# Patient Record
Sex: Male | Born: 1955 | Race: White | Hispanic: No | Smoking: Former smoker
Health system: Southern US, Community
[De-identification: ages and names within clinical notes are randomized; demographics above are authoritative.]

## PROBLEM LIST (undated history)

## (undated) DIAGNOSIS — J45909 Unspecified asthma, uncomplicated: Secondary | ICD-10-CM

## (undated) DIAGNOSIS — G919 Hydrocephalus, unspecified: Secondary | ICD-10-CM

## (undated) DIAGNOSIS — M1990 Unspecified osteoarthritis, unspecified site: Secondary | ICD-10-CM

## (undated) DIAGNOSIS — R51 Headache: Secondary | ICD-10-CM

## (undated) DIAGNOSIS — E119 Type 2 diabetes mellitus without complications: Secondary | ICD-10-CM

## (undated) DIAGNOSIS — G4730 Sleep apnea, unspecified: Secondary | ICD-10-CM

## (undated) DIAGNOSIS — J449 Chronic obstructive pulmonary disease, unspecified: Secondary | ICD-10-CM

## (undated) DIAGNOSIS — I10 Essential (primary) hypertension: Secondary | ICD-10-CM

## (undated) DIAGNOSIS — R519 Headache, unspecified: Secondary | ICD-10-CM

## (undated) DIAGNOSIS — IMO0001 Reserved for inherently not codable concepts without codable children: Secondary | ICD-10-CM

## (undated) HISTORY — DX: Sleep apnea, unspecified: G47.30

## (undated) HISTORY — DX: Essential (primary) hypertension: I10

## (undated) HISTORY — DX: Type 2 diabetes mellitus without complications: E11.9

## (undated) HISTORY — DX: Hydrocephalus, unspecified: G91.9

## (undated) HISTORY — PX: VENTRICULOPERITONEAL SHUNT: SHX204

## (undated) HISTORY — PX: TONSILLECTOMY: SUR1361

## (undated) HISTORY — DX: Unspecified asthma, uncomplicated: J45.909

## (undated) HISTORY — PX: OTHER SURGICAL HISTORY: SHX169

---

## 2002-02-14 ENCOUNTER — Encounter: Admission: RE | Admit: 2002-02-14 | Discharge: 2002-02-14 | Payer: Self-pay | Admitting: *Deleted

## 2002-02-27 ENCOUNTER — Encounter: Payer: Self-pay | Admitting: Neurosurgery

## 2002-02-28 ENCOUNTER — Encounter: Payer: Self-pay | Admitting: Neurosurgery

## 2002-02-28 ENCOUNTER — Inpatient Hospital Stay (HOSPITAL_COMMUNITY): Admission: RE | Admit: 2002-02-28 | Discharge: 2002-03-03 | Payer: Self-pay | Admitting: Neurosurgery

## 2002-04-27 ENCOUNTER — Encounter: Payer: Self-pay | Admitting: Neurosurgery

## 2002-04-27 ENCOUNTER — Encounter: Admission: RE | Admit: 2002-04-27 | Discharge: 2002-04-27 | Payer: Self-pay | Admitting: Neurosurgery

## 2004-10-16 ENCOUNTER — Encounter: Admission: RE | Admit: 2004-10-16 | Discharge: 2005-01-14 | Payer: Self-pay | Admitting: Internal Medicine

## 2006-07-13 ENCOUNTER — Encounter: Admission: RE | Admit: 2006-07-13 | Discharge: 2006-07-13 | Payer: Self-pay | Admitting: Internal Medicine

## 2006-11-11 ENCOUNTER — Encounter: Admission: RE | Admit: 2006-11-11 | Discharge: 2006-11-11 | Payer: Self-pay | Admitting: Internal Medicine

## 2007-01-28 ENCOUNTER — Emergency Department (HOSPITAL_COMMUNITY): Admission: EM | Admit: 2007-01-28 | Discharge: 2007-01-28 | Payer: Self-pay | Admitting: Emergency Medicine

## 2007-10-19 ENCOUNTER — Ambulatory Visit: Payer: Self-pay | Admitting: Family Medicine

## 2007-10-20 ENCOUNTER — Ambulatory Visit: Payer: Self-pay | Admitting: Internal Medicine

## 2007-10-20 LAB — CONVERTED CEMR LAB
AST: 24 units/L (ref 0–37)
Albumin: 4.5 g/dL (ref 3.5–5.2)
BUN: 10 mg/dL (ref 6–23)
Chloride: 101 meq/L (ref 96–112)
Cholesterol: 180 mg/dL (ref 0–200)
Creatinine, Ser: 0.83 mg/dL (ref 0.40–1.50)
Eosinophils Relative: 2 % (ref 0–5)
Hemoglobin: 14.9 g/dL (ref 13.0–17.0)
LDL Cholesterol: 87 mg/dL (ref 0–99)
Lymphs Abs: 1.5 10*3/uL (ref 0.7–4.0)
Monocytes Absolute: 0.9 10*3/uL (ref 0.1–1.0)
Monocytes Relative: 15 % — ABNORMAL HIGH (ref 3–12)
Neutro Abs: 3.3 10*3/uL (ref 1.7–7.7)
Potassium: 4 meq/L (ref 3.5–5.3)
RBC: 4.76 M/uL (ref 4.22–5.81)
RDW: 13.5 % (ref 11.5–15.5)
Total Bilirubin: 0.7 mg/dL (ref 0.3–1.2)
Total CHOL/HDL Ratio: 3.8
VLDL: 46 mg/dL — ABNORMAL HIGH (ref 0–40)

## 2007-10-25 ENCOUNTER — Ambulatory Visit: Payer: Self-pay | Admitting: *Deleted

## 2007-12-05 ENCOUNTER — Ambulatory Visit: Payer: Self-pay | Admitting: Internal Medicine

## 2007-12-12 ENCOUNTER — Encounter (INDEPENDENT_AMBULATORY_CARE_PROVIDER_SITE_OTHER): Payer: Self-pay | Admitting: Internal Medicine

## 2007-12-12 ENCOUNTER — Ambulatory Visit: Payer: Self-pay | Admitting: Family Medicine

## 2007-12-12 LAB — CONVERTED CEMR LAB
BUN: 12 mg/dL (ref 6–23)
Creatinine, Ser: 0.85 mg/dL (ref 0.40–1.50)
Potassium: 3.7 meq/L (ref 3.5–5.3)
Sodium: 141 meq/L (ref 135–145)

## 2008-01-04 ENCOUNTER — Ambulatory Visit: Payer: Self-pay | Admitting: Internal Medicine

## 2008-02-03 ENCOUNTER — Ambulatory Visit: Payer: Self-pay | Admitting: Internal Medicine

## 2008-02-23 ENCOUNTER — Ambulatory Visit: Payer: Self-pay | Admitting: Internal Medicine

## 2008-02-24 ENCOUNTER — Ambulatory Visit: Payer: Self-pay | Admitting: Family Medicine

## 2008-03-09 ENCOUNTER — Ambulatory Visit: Payer: Self-pay | Admitting: Internal Medicine

## 2008-03-22 ENCOUNTER — Ambulatory Visit: Payer: Self-pay | Admitting: Internal Medicine

## 2008-03-27 ENCOUNTER — Ambulatory Visit: Payer: Self-pay | Admitting: Internal Medicine

## 2008-05-14 ENCOUNTER — Ambulatory Visit: Payer: Self-pay | Admitting: Internal Medicine

## 2008-06-07 ENCOUNTER — Ambulatory Visit: Payer: Self-pay | Admitting: Internal Medicine

## 2008-06-07 ENCOUNTER — Emergency Department (HOSPITAL_COMMUNITY): Admission: EM | Admit: 2008-06-07 | Discharge: 2008-06-07 | Payer: Self-pay | Admitting: Family Medicine

## 2008-06-28 ENCOUNTER — Ambulatory Visit: Payer: Self-pay | Admitting: Internal Medicine

## 2008-06-28 ENCOUNTER — Encounter: Payer: Self-pay | Admitting: Family Medicine

## 2008-06-28 LAB — CONVERTED CEMR LAB
Cholesterol: 224 mg/dL — ABNORMAL HIGH (ref 0–200)
Triglycerides: 372 mg/dL — ABNORMAL HIGH (ref <150)
VLDL: 74 mg/dL — ABNORMAL HIGH (ref 0–40)

## 2008-07-04 ENCOUNTER — Ambulatory Visit: Payer: Self-pay | Admitting: Internal Medicine

## 2008-07-04 ENCOUNTER — Encounter: Payer: Self-pay | Admitting: Family Medicine

## 2008-07-04 LAB — CONVERTED CEMR LAB
AST: 23 units/L (ref 0–37)
Alkaline Phosphatase: 77 units/L (ref 39–117)
Creatinine, Ser: 0.75 mg/dL (ref 0.40–1.50)
Glucose, Bld: 99 mg/dL (ref 70–99)

## 2008-07-25 ENCOUNTER — Ambulatory Visit: Payer: Self-pay | Admitting: Family Medicine

## 2008-09-25 ENCOUNTER — Ambulatory Visit: Payer: Self-pay | Admitting: Family Medicine

## 2008-10-03 ENCOUNTER — Ambulatory Visit: Payer: Self-pay | Admitting: Sports Medicine

## 2008-10-03 DIAGNOSIS — M25519 Pain in unspecified shoulder: Secondary | ICD-10-CM | POA: Insufficient documentation

## 2008-10-03 DIAGNOSIS — M25569 Pain in unspecified knee: Secondary | ICD-10-CM

## 2008-10-09 ENCOUNTER — Telehealth (INDEPENDENT_AMBULATORY_CARE_PROVIDER_SITE_OTHER): Payer: Self-pay | Admitting: Family Medicine

## 2008-11-12 ENCOUNTER — Ambulatory Visit: Payer: Self-pay | Admitting: Family Medicine

## 2008-11-28 ENCOUNTER — Ambulatory Visit: Payer: Self-pay | Admitting: Sports Medicine

## 2008-11-28 DIAGNOSIS — M2140 Flat foot [pes planus] (acquired), unspecified foot: Secondary | ICD-10-CM | POA: Insufficient documentation

## 2008-11-28 DIAGNOSIS — M722 Plantar fascial fibromatosis: Secondary | ICD-10-CM | POA: Insufficient documentation

## 2008-11-28 DIAGNOSIS — M79609 Pain in unspecified limb: Secondary | ICD-10-CM

## 2008-12-17 ENCOUNTER — Ambulatory Visit: Payer: Self-pay | Admitting: Family Medicine

## 2008-12-18 ENCOUNTER — Ambulatory Visit (HOSPITAL_COMMUNITY): Admission: RE | Admit: 2008-12-18 | Discharge: 2008-12-18 | Payer: Self-pay | Admitting: Family Medicine

## 2009-01-18 ENCOUNTER — Emergency Department (HOSPITAL_COMMUNITY): Admission: EM | Admit: 2009-01-18 | Discharge: 2009-01-18 | Payer: Self-pay | Admitting: Family Medicine

## 2009-04-30 ENCOUNTER — Ambulatory Visit (HOSPITAL_COMMUNITY): Admission: RE | Admit: 2009-04-30 | Discharge: 2009-04-30 | Payer: Self-pay | Admitting: Neurosurgery

## 2009-05-10 ENCOUNTER — Ambulatory Visit: Payer: Self-pay | Admitting: Family Medicine

## 2009-05-24 ENCOUNTER — Inpatient Hospital Stay (HOSPITAL_COMMUNITY): Admission: RE | Admit: 2009-05-24 | Discharge: 2009-05-25 | Payer: Self-pay | Admitting: Neurosurgery

## 2009-06-17 ENCOUNTER — Ambulatory Visit: Payer: Self-pay | Admitting: Internal Medicine

## 2009-07-22 ENCOUNTER — Encounter: Admission: RE | Admit: 2009-07-22 | Discharge: 2009-07-22 | Payer: Self-pay | Admitting: Neurosurgery

## 2009-09-06 ENCOUNTER — Ambulatory Visit: Payer: Self-pay | Admitting: Internal Medicine

## 2009-09-06 LAB — CONVERTED CEMR LAB
AST: 24 units/L (ref 0–37)
Albumin: 4.2 g/dL (ref 3.5–5.2)
BUN: 15 mg/dL (ref 6–23)
CO2: 27 meq/L (ref 19–32)
Chloride: 106 meq/L (ref 96–112)
Creatinine, Ser: 0.83 mg/dL (ref 0.40–1.50)
HDL: 51 mg/dL (ref >39)
Total CHOL/HDL Ratio: 3.7
VLDL: 54 mg/dL — ABNORMAL HIGH (ref 0–40)

## 2009-09-17 ENCOUNTER — Ambulatory Visit: Payer: Self-pay | Admitting: Internal Medicine

## 2009-10-10 ENCOUNTER — Ambulatory Visit: Payer: Self-pay | Admitting: Internal Medicine

## 2009-11-27 ENCOUNTER — Emergency Department (HOSPITAL_COMMUNITY): Admission: EM | Admit: 2009-11-27 | Discharge: 2009-11-27 | Payer: Self-pay | Admitting: Emergency Medicine

## 2009-12-05 ENCOUNTER — Ambulatory Visit: Payer: Self-pay | Admitting: Family Medicine

## 2009-12-05 ENCOUNTER — Encounter (INDEPENDENT_AMBULATORY_CARE_PROVIDER_SITE_OTHER): Payer: Self-pay | Admitting: Internal Medicine

## 2009-12-05 LAB — CONVERTED CEMR LAB
CO2: 30 meq/L (ref 19–32)
Chloride: 101 meq/L (ref 96–112)
Hgb A1c MFr Bld: 5.7 % (ref 4.6–6.1)
Potassium: 4.1 meq/L (ref 3.5–5.3)
Sodium: 143 meq/L (ref 135–145)

## 2010-04-25 ENCOUNTER — Encounter: Admission: RE | Admit: 2010-04-25 | Discharge: 2010-04-25 | Payer: Self-pay | Admitting: Neurosurgery

## 2010-05-17 ENCOUNTER — Emergency Department (HOSPITAL_COMMUNITY): Admission: EM | Admit: 2010-05-17 | Discharge: 2010-05-18 | Payer: Self-pay | Admitting: Emergency Medicine

## 2010-05-25 ENCOUNTER — Emergency Department (HOSPITAL_COMMUNITY): Admission: EM | Admit: 2010-05-25 | Discharge: 2010-05-25 | Payer: Self-pay | Admitting: Internal Medicine

## 2010-10-19 ENCOUNTER — Encounter: Payer: Self-pay | Admitting: Neurosurgery

## 2011-01-03 LAB — BASIC METABOLIC PANEL
CO2: 30 mEq/L (ref 19–32)
Creatinine, Ser: 0.69 mg/dL (ref 0.4–1.5)
GFR calc non Af Amer: >60 mL/min (ref >60)
Glucose, Bld: 101 mg/dL — ABNORMAL HIGH (ref 70–99)

## 2011-01-03 LAB — CBC
HCT: 40.5 % (ref 39.0–52.0)
MCHC: 33.7 g/dL (ref 30.0–36.0)
RDW: 14.6 % (ref 11.5–15.5)

## 2011-02-10 NOTE — Op Note (Signed)
NAMESTEELE, STRACENER NO.:  [id number]   MEDICAL RECORD NO.:  [id number]          PATIENT TYPE:  INP   LOCATION:  3029                         FACILITY:  [facility]   PHYSICIAN:  Kathaleen Maser. Pool, M.D.    DATE OF BIRTH:  01/29/1956   DATE OF PROCEDURE:  05/24/2009  DATE OF DISCHARGE:                               OPERATIVE REPORT   PREOPERATIVE DIAGNOSIS:  Obstructive hydrocephalus.   POSTOPERATIVE DIAGNOSIS:  Obstructive hydrocephalus.   PROCEDURE NOTE:  Right occipital ventriculoperitoneal shunt.   SURGEON:  Kathaleen Maser. Pool, MD   ASSISTANT:  Tia Alert, MD   ANESTHESIA:  General endotracheal.   INDICATION:  Mr. Bamber is a 55 year old male with history of congenital  hydrocephalus status post VP shunting at age 20.  The patient has  history consistent with chronic shunt malfunction and progressive  hydrocephalus.  The patient presents now for VP shunt placement in hopes  of improving his symptoms.   OPERATIVE NOTE:  The patient was brought to the operating room and  placed on operating table in supine position.  After adequate level of  anesthesia was achieved, the patient placed supine with his neck turned  towards the left.  The patient's right occipital scalp, neck, chest, and  abdomen were prepped and draped sterilely.  A 10 blade was used to make  a linear incision in the right occipital region.  This was carried down  sharply to the pericranium.  A single bur hole was then made.  A pocket  was made inferior to the bur hole.  A shunt passer was then passed from  the cranial wound to a separate abdominal wound which was opened  simultaneously through a right paramedian approach.  This was carried  down to the anterior rectus fascia.  Anterior rectus fascia was split.  The rectus fibers were separated.  Posterior rectus fascia was grasped  and divided.  Peritoneum was identified and incised and opened.  Returning to the cranial wound, the dura was  coagulated.  A 9-cm  straight ventricular catheter was then passed into the ventricle with  good return of CSF under pressure.  This was then connected to the snap  assembly on a Strata II valve which had been set at performance level of  0.5.  There was good flow through the shunt system and out the distal  aspect of the shunt tube.  The distal catheter was then passed in the  peritoneal cavity without difficulty.  Both wounds were then irrigated  with antibiotic solution and closed in typical fashion.  Sterile  dressings were applied.  There were no complications.  The patient  tolerated the procedure well and he returns to recovery room  postoperatively.           ______________________________  Kathaleen Maser Pool, M.D.     HAP/MEDQ  D:  05/24/2009  T:  05/25/2009  Job:  [id number]

## 2011-02-13 NOTE — Op Note (Signed)
[place]. [facility]  Patient:    DONTEA, CORLEW Visit Number: [id number] MRN: [id number]          Service Type: SUR Location: 3000 3023 01 Attending Physician:  Danella Penton Dictated by:   Tanya Nones. Jeral Fruit, M.D. Proc. 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  PROCEDURES: 1. C5 corpectomy, partial of C4 and C6. 2. Foraminotomy to decompress the C5 and C6 nerve roots. 3. Removal of posterior calcified ligament with the microscope. 4. Bone bank graft. 5. Plate.  SURGEON:  Tanya Nones. Jeral Fruit, M.D.  ASSISTANTMena Goes. Franky Macho, M.D.  CLINICAL HISTORY:  The patient was admitted because of weakness of the upper extremities and problems with coordination.  MRI had shown calcification going from the lower part of C4 down to the upper part of C6.  He has a chronic weakness of the biceps and deltoid.  Surgery was advised.  The risks were explained in the history and physical.  DESCRIPTION OF PROCEDURE:  The patient was taken to the OR after intubation which was done in a neutral position.  Ten pounds of traction was applied to the neck.  The patient is 5 feet 2 inches and over 240 pounds of weight, so it was difficult to have a good visualization of the neck.  Nevertheless, the area was prepped with Betadine and drapes were applied.  A longitudinal incision was made through the skin and platysma down into the area of the trachea.  Dissection was carried out, and we were able to visualize the cervical spine.  From then on we had to do two x-rays because it was difficult to see where we were.   The second x-ray showed that we were at the level of C4-5.  From then on the disks at 4-5 and 5-6 were removed using microcurettes, with the help of the microscope.  Using the drill, we did a corpectomy which involved the three-quarters medial of the bone at C5.  The same procedure was done to remove the lower part of C4 and the upper part of C6.  With the microscope, we started doing our microdissection.  Indeed, there was a calcified ligament up to the point that we had to go all the way to 4 before we could see normal dura mater.  Dissection was carried down using the 1 and 2 mm Kerrison punch and with microdissection tried to remove the calcified ligament that was on the spinal cord.  This procedure also was done from C4 down to C5 and from C6 up to C5.  Then we visualized the foramina, which were quite narrow.  Using the 1, 2, and 3 mm Kerrison punch, we were able to do foraminotomy to decompress the C5 and C6 nerve root.  At the end we had a decompression.  Although there was no evidence of any CSF leak, nevertheless we used Tisseel to produce a barrier in the posterior ligament and dura mater. Then a bone graft was tailored that went from C4 down to C6.  Again  three attempts were made to localize the bone graft, but it was difficult. Nevertheless, using micro-hooks we were able to see there was plenty of space between the posterior aspect of the graft and the dura mater.  Then using the plate with four screws, we fixed the bone graft and plate.  Again, one more attempt was done to try to visualize, but it was difficult.  Nevertheless, we knew that we were right at the level of 5 and 6 with the screws.  Having done this, the area was irrigated.  Jackson-Pratt was left in the operative site. The wound was closed with Vicryl and Steri-Strip.  The patient is going to go to the intensive care unit. Dictated by:   Tanya Nones. Jeral Fruit, M.D. Attending Physician:  Danella Penton DD:   02/28/02 TD:  03/02/02 Job: [phone]

## 2011-02-13 NOTE — H&P (Signed)
[place]. [facility]  Patient:    Eric Houston, Eric Houston Visit Number: [id number] MRN: [id number]          Service Type: SUR Location: 3000 3023 01 Attending Physician:  Danella Penton Dictated by:   Tanya Nones. Jeral Fruit, M.D. Admit Date:  02/28/2002                           History and Physical  HISTORY OF PRESENT ILLNESS:  The patient is a gentleman who was seen in my office for several months, who has been complaining of weakness in the upper extremities, some leg pain and burning sensation going to the shoulders.  At work, he had realized that he is having more difficulty because what he does involves quite a bit of lifting.  The patient is quite miserable.  He was seen by me after I took a look at the x-ray and I brought him to my office as an emergency.  PAST MEDICAL HISTORY:  He had a shunt when he was 55 years old; it seems that the shunt was secondary to hydrocephalus.  ALLERGIES:  He is not allergic to any medication.  MEDICATIONS:  He is taking Zoloft for depression, Vicodin and Toprol.  SOCIAL HISTORY:  Negative.  He is 5 feet 6 inches.  He is 280 pounds.  FAMILY HISTORY:  Unremarkable.  REVIEW OF SYSTEMS:  Review of systems is positive for weakness in the arms and the legs, back pain and neck pain.  He does have a history of high blood pressure.  PHYSICAL EXAMINATION:  HEENT:  He has a shunt on the left side; the valve does not work really well. He has bossing of both frontal bones secondary to hydrocephalus.  NECK:  Extension and lateralization cause him discomfort.  LUNGS:  There are some rhonchi bilaterally.  HEART:  Heart sounds normal.  ABDOMEN:  Normal.  EXTREMITIES:  There is brawny edema.  NEUROLOGIC:  Mental status normal.  Cranial nerves normal.  Strength: Deltoids are 1/5, right biceps 2/5, left biceps 3/5 and he has weakness in both wrist extensors.  Triceps are completely normal.  In the lower extremities, I  cannot find any weakness.  Sensation grossly normal.  Reflexes: Hyperreflexia in the upper and lower extremities and questionable Babinski on the right side.  Coordination:  He is unable to walk a straight line.  LABORATORY AND ACCESSORY DATA:  The plain x-ray and the MRI are quite impressive.  This gentleman has calcified ligament which goes from C4, C5 and C6 with old radiation changes of gliosis in the spinal cord.  CLINICAL IMPRESSION:  Cervical stenosis secondary to calcification of posterior ligament from C4 down to C6.  There is some question of spondylosis on the left side at the level of C6-7 with no weakness.  RECOMMENDATION:  I talked to him and his wife at length.  The patient is going to be admitted for surgery.  The procedure will be a C5 corpectomy, probably a partial corpectomy at 4 and 6, and a bone graft and a plate from C4 to C6. Also, there is a possibility that instead of the bone graft, we might put a cage.  The risks of course are infection, paralysis, no improvement whatsoever, need for further surgery, collapse of the bone graft, damage to the vocal cords, damage to the esophagus or stroke.  He declined more opinion.Dictated by:   Tanya Nones. Jeral Fruit, M.D. Attending Physician:  Hilda Lias Clarence DD:  02/28/02 TD:  03/01/02 Job: [phone]

## 2011-02-13 NOTE — Discharge Summary (Signed)
[place]. [facility]  Patient:    Eric Houston, Eric Houston Visit Number: [id number] MRN: [id number]          Service Type: SUR Location: 3000 3023 01 Attending Physician:  Danella Penton Dictated by:   Tanya Nones. Jeral Fruit, M.D. Admit Date:  02/28/2002 Discharge Date: 03/03/2002                             Discharge Summary  ADMITTING DIAGNOSIS:  Cervical stenosis secondary to calcification of the posterior ligament, following the ____ of the same.  CLINICAL HISTORY:  The patient was admitted because ofincoordination and weakness in the upper extremity.  An MRI showed stenosis at the level of C4-5 and C5-6, secondary to calcification of the posterior ligament.  Surgery was advised.  LABORATORY DATA:  Normal.  HOSPITAL COURSE:  The patient was taken to surgery and a C5 corpectomy, as well as a partial C4 and C6 was done.  This was followed by a graft and a plate.  DISPOSITION:  Today the patient is doing much better.  The weakness is gone. He has no difficulty breathing, and has been discharged today, to be seen by me in my office.  CONDITION ON DISCHARGE:  Improving.  DISCHARGE MEDICATIONS:  Percocet and Flexeril.  He is to continue taking all of his previous medications.  DIET:  Regular, although he was encouraged to lose weight.  ACTIVITIES:  Not to drive for at least 10 days.  FOLLOWUP:  In four weeks in my office.  Dictated by:   Tanya Nones. Jeral Fruit, M.D. Attending Physician:  Danella Penton DD:  03/03/02 TD:  03/06/02 Job: [id number]

## 2011-05-28 ENCOUNTER — Ambulatory Visit
Admission: RE | Admit: 2011-05-28 | Discharge: 2011-05-28 | Disposition: A | Discharge status: Home / Self Care | Payer: Medicare HMO | Source: Ambulatory Visit | Attending: Geriatric Medicine | Admitting: Geriatric Medicine

## 2011-05-28 ENCOUNTER — Other Ambulatory Visit: Payer: Self-pay | Admitting: Geriatric Medicine

## 2011-05-28 DIAGNOSIS — R0600 Dyspnea, unspecified: Secondary | ICD-10-CM

## 2011-05-28 DIAGNOSIS — R05 Cough: Secondary | ICD-10-CM

## 2011-05-28 DIAGNOSIS — R062 Wheezing: Secondary | ICD-10-CM

## 2011-06-15 ENCOUNTER — Encounter: Payer: Self-pay | Admitting: Critical Care Medicine

## 2011-06-15 ENCOUNTER — Ambulatory Visit: Payer: Medicare HMO | Admitting: Critical Care Medicine

## 2011-06-15 ENCOUNTER — Ambulatory Visit (INDEPENDENT_AMBULATORY_CARE_PROVIDER_SITE_OTHER): Payer: Medicare HMO | Admitting: Critical Care Medicine

## 2011-06-15 DIAGNOSIS — G919 Hydrocephalus, unspecified: Secondary | ICD-10-CM | POA: Insufficient documentation

## 2011-06-15 DIAGNOSIS — J45909 Unspecified asthma, uncomplicated: Secondary | ICD-10-CM | POA: Insufficient documentation

## 2011-06-15 DIAGNOSIS — I10 Essential (primary) hypertension: Secondary | ICD-10-CM

## 2011-06-15 DIAGNOSIS — E119 Type 2 diabetes mellitus without complications: Secondary | ICD-10-CM | POA: Insufficient documentation

## 2011-06-15 DIAGNOSIS — G4730 Sleep apnea, unspecified: Secondary | ICD-10-CM

## 2011-06-15 DIAGNOSIS — G911 Obstructive hydrocephalus: Secondary | ICD-10-CM

## 2011-06-15 MED ORDER — BUDESONIDE-FORMOTEROL FUMARATE 160-4.5 MCG/ACT IN AERO: 2.0000 | INHALATION_SPRAY | Freq: Two times a day (BID) | RESPIRATORY_TRACT | Status: DC

## 2011-06-15 MED ORDER — PREDNISONE 10 MG PO TABS: 10.0000 mg | ORAL_TABLET | Freq: Every day | ORAL | Status: DC

## 2011-06-15 MED ORDER — TIOTROPIUM BROMIDE MONOHYDRATE 18 MCG IN CAPS: ORAL_CAPSULE | RESPIRATORY_TRACT | Status: DC

## 2011-06-15 MED ORDER — PREDNISONE 10 MG PO TABS: ORAL_TABLET | ORAL | Status: DC

## 2011-06-15 MED ORDER — LOSARTAN POTASSIUM-HCTZ 100-25 MG PO TABS: 1.0000 | ORAL_TABLET | Freq: Every day | ORAL | Status: DC

## 2011-06-15 NOTE — Patient Instructions (Addendum)
Stop lisinopril/hctz Start losartan/hctz one daily Prednisone 10mg  Take 4 for two days three for two days two for two days one for two days then stop Symbicort two puff twice daily When current spiriva runs out , do not refill Obtain a full pulmonary function study Return 6 weeks

## 2011-06-15 NOTE — Assessment & Plan Note (Addendum)
Asthmatic bronchitis with chronic airflow obstruction and cyclical cough precipitated by an Ace inhibitor  Plan Stop lisinopril/hctz Start losartan/hctz one daily Prednisone 10mg  Take 4 for two days three for two days two for two days one for two days then stop Symbicort two puff twice daily D/c  current spiriva runs out , do not refill Obtain a full pulmonary function study Return 6 weeks

## 2011-06-15 NOTE — Progress Notes (Signed)
Subjective:    Patient ID: Eric Houston, male    DOB: 03/21/1956, 55 y.o.   MRN: [id number]  Shortness of Breath This is a chronic problem. The current episode started more than 1 year ago. The problem occurs daily (exertional only). The problem has been gradually worsening. Associated symptoms include chest pain, rhinorrhea, sputum production and wheezing. Pertinent negatives include no abdominal pain, fever, headaches, hemoptysis, leg swelling, orthopnea, PND, sore throat or vomiting. The symptoms are aggravated by smoke, weather changes, odors, fumes, any activity, exercise and lying flat. Associated symptoms comments: Has L sided rib pain Mucus is clear . He has tried beta agonist inhalers for the symptoms. The treatment provided no relief. His past medical history is significant for asthma. There is no history of CAD, COPD, DVT, a heart failure, PE or pneumonia.  Cough This is a chronic problem. The current episode started more than 1 year ago. The problem has been gradually worsening. The cough is productive of sputum. Associated symptoms include chest pain, postnasal drip, rhinorrhea, shortness of breath and wheezing. Pertinent negatives include no fever, headaches, hemoptysis, nasal congestion or sore throat. The symptoms are aggravated by fumes, pollens, lying down, dust and exercise. He has tried a beta-agonist inhaler for the symptoms. The treatment provided no relief. His past medical history is significant for asthma and bronchitis. There is no history of bronchiectasis, COPD, emphysema, environmental allergies or pneumonia.   55 y.o.WM  Dx AB as child.  Now ? Copd.  Not much of a smoker, had passive smoke exposure.   Past Medical History  Diagnosis Date  . Hypertension   . Diabetes mellitus type II   . Asthmatic bronchitis   . Sleep apnea   . Hydrocephalus     vp shunt     Family History  Problem Relation Age of Onset  . Emphysema Father   . Cancer Mother     pancreatic    . Diabetes Mother   . Diabetes Father   . Diabetes Brother      History   Social History  . Marital Status: Legally Separated    Spouse Name: N/A    Number of Children: 2  . Years of Education: N/A   Occupational History  . Disability    Social History Main Topics  . Smoking status: Former Smoker -- 2.0 packs/day for 2 years    Types: Cigarettes    Quit date: 11/14/1984  . Smokeless tobacco: Never Used  . Alcohol Use: Yes     occasionally on weekends  . Drug Use: No  . Sexually Active: Not on file   Other Topics Concern  . Not on file   Social History Narrative  . No narrative on file     Allergies  Allergen Reactions  . Sulfa Antibiotics     rash     No outpatient prescriptions prior to visit.     Review of Systems  Constitutional: Negative for fever.  HENT: Positive for rhinorrhea and postnasal drip. Negative for sore throat and sneezing.   Respiratory: Positive for cough, sputum production, shortness of breath and wheezing. Negative for hemoptysis.   Cardiovascular: Positive for chest pain. Negative for orthopnea, leg swelling and PND.  Gastrointestinal: Negative for vomiting and abdominal pain.       Hx of heartburn, on meds in past for GERD  Neurological: Negative for dizziness and headaches.  Hematological: Negative for environmental allergies.  Psychiatric/Behavioral: The patient is nervous/anxious.    Constitutional:  No  weight loss, night sweats,  Fevers, chills, fatigue, lassitude. HEENT:   No headaches,  Difficulty swallowing,  Tooth/dental problems,  Sore throat,                No sneezing, itching, ear ache, nasal congestion, post nasal drip,   CV:  No chest pain,  Orthopnea, PND, swelling in lower extremities, anasarca, dizziness, palpitations  GI  No heartburn, indigestion, abdominal pain, nausea, vomiting, diarrhea, change in bowel habits, loss of appetite  Resp: Notes  shortness of breath with exertion not  at rest.  Notes  excess  mucus, notes  productive cough,  No non-productive cough,  No coughing up of blood.  No change in color of mucus.  No wheezing.  No chest wall deformity  Skin: no rash or lesions.  GU: no dysuria, change in color of urine, no urgency or frequency.  No flank pain.  MS:  No joint pain or swelling.  No decreased range of motion.  No back pain.  Psych:  No change in mood or affect. No depression or anxiety.  No memory loss.     Objective:   Physical Exam Filed Vitals:   06/15/11 1501  BP: 150/98  Pulse: 93  Temp: 98.1 F (36.7 C)  TempSrc: Oral  Height: 5\' 6"  (1.676 m)  Weight: 322 lb 12.8 oz (146.421 kg)  SpO2: 94%    Gen: Pleasant, well-nourished, in no distress,  normal affect  ENT: No lesions,  mouth clear,  oropharynx clear, no postnasal drip  Neck: No JVD, no TMG, no carotid bruits  Lungs: No use of accessory muscles, no dullness to percussion, distant BS, poor airway function  Cardiovascular: RRR, heart sounds normal, no murmur or gallops, no peripheral edema  Abdomen: soft and NT, no HSM,  BS normal  Musculoskeletal: No deformities, no cyanosis or clubbing  Neuro: alert, non focal  Skin: Warm, no lesions or rashes  CXR: chronic bronchial wall thickening NAD      Assessment & Plan:   Asthmatic bronchitis Asthmatic bronchitis with chronic airflow obstruction and cyclical cough precipitated by an Ace inhibitor  Plan Stop lisinopril/hctz Start losartan/hctz one daily Prednisone 10mg  Take 4 for two days three for two days two for two days one for two days then stop Symbicort two puff twice daily D/c  current spiriva runs out , do not refill Obtain a full pulmonary function study Return 6 weeks       Updated Medication List Outpatient Encounter Prescriptions as of 06/15/2011  Medication Sig Dispense Refill  . albuterol (VENTOLIN HFA) 108 (90 BASE) MCG/ACT inhaler Inhale 2 puffs into the lungs 4 (four) times daily.        . Loratadine 10 MG CAPS Take  1 capsule by mouth daily.        . metFORMIN (GLUCOPHAGE) 500 MG tablet Take 500 mg by mouth 2 (two) times daily.        . promethazine-codeine (PHENERGAN WITH CODEINE) 6.25-10 MG/5ML syrup 1 tsp every 4-6 hours as needed       . tiotropium (SPIRIVA) 18 MCG inhalation capsule When current supply runs out, do NOT refill  30 capsule    . DISCONTD: lisinopril-hydrochlorothiazide (PRINZIDE,ZESTORETIC) 20-25 MG per tablet Take 1 tablet by mouth daily.        Marland Kitchen DISCONTD: tiotropium (SPIRIVA) 18 MCG inhalation capsule Place 18 mcg into inhaler and inhale daily.        . budesonide-formoterol (SYMBICORT) 160-4.5 MCG/ACT inhaler Inhale 2 puffs  into the lungs 2 (two) times daily.  1 Inhaler  12  . losartan-hydrochlorothiazide (HYZAAR) 100-25 MG per tablet Take 1 tablet by mouth daily.  30 tablet  6  . predniSONE (DELTASONE) 10 MG tablet Take 4 for two days three for two days two for two days one for two days  20 tablet  0  . DISCONTD: predniSONE (DELTASONE) 10 MG tablet Take 1 tablet (10 mg total) by mouth daily.  10 tablet  0

## 2011-06-19 ENCOUNTER — Institutional Professional Consult (permissible substitution): Payer: Medicare HMO | Admitting: Critical Care Medicine

## 2011-06-25 ENCOUNTER — Ambulatory Visit (INDEPENDENT_AMBULATORY_CARE_PROVIDER_SITE_OTHER): Payer: Medicare HMO | Admitting: Critical Care Medicine

## 2011-06-25 DIAGNOSIS — J45909 Unspecified asthma, uncomplicated: Secondary | ICD-10-CM

## 2011-06-25 LAB — PULMONARY FUNCTION TEST

## 2011-06-25 NOTE — Progress Notes (Signed)
PFT done today. 

## 2011-06-30 ENCOUNTER — Telehealth: Payer: Self-pay | Admitting: Critical Care Medicine

## 2011-06-30 DIAGNOSIS — J45909 Unspecified asthma, uncomplicated: Secondary | ICD-10-CM

## 2011-06-30 NOTE — Telephone Encounter (Signed)
I called and left msg on results of PFTs  Please manually enter PFTs into EMR

## 2011-07-01 ENCOUNTER — Encounter: Payer: Self-pay | Admitting: Critical Care Medicine

## 2011-07-01 LAB — POCT I-STAT, CHEM 8
Creatinine, Ser: 0.9
HCT: 42
Hemoglobin: 14.3
Sodium: 140
TCO2: 30

## 2011-07-01 NOTE — Telephone Encounter (Signed)
PFT results entered into Epic.

## 2011-07-02 NOTE — Telephone Encounter (Signed)
Will place PFTs in PW's scan folder to be scanned into pt's chart.

## 2011-07-27 ENCOUNTER — Ambulatory Visit: Payer: Medicare HMO | Admitting: Critical Care Medicine

## 2011-08-19 ENCOUNTER — Ambulatory Visit: Payer: Medicare HMO | Admitting: Critical Care Medicine

## 2012-01-25 ENCOUNTER — Other Ambulatory Visit: Payer: Self-pay | Admitting: Critical Care Medicine

## 2012-06-21 ENCOUNTER — Other Ambulatory Visit: Payer: Self-pay | Admitting: Neurosurgery

## 2012-06-21 ENCOUNTER — Ambulatory Visit
Admission: RE | Admit: 2012-06-21 | Discharge: 2012-06-21 | Disposition: A | Discharge status: Home / Self Care | Payer: Medicare HMO | Source: Ambulatory Visit | Attending: Neurosurgery | Admitting: Neurosurgery

## 2012-06-21 DIAGNOSIS — Q039 Congenital hydrocephalus, unspecified: Secondary | ICD-10-CM

## 2013-03-04 IMAGING — CR DG CHEST 2V
3 series · 3 of 3 positions shown · non-contrast
Comparison: Chest x-ray 05/21/2009.

CLINICAL DATA: Cough and wheezing.

CHEST - 2 VIEW

[view not recorded (1 of 3)]
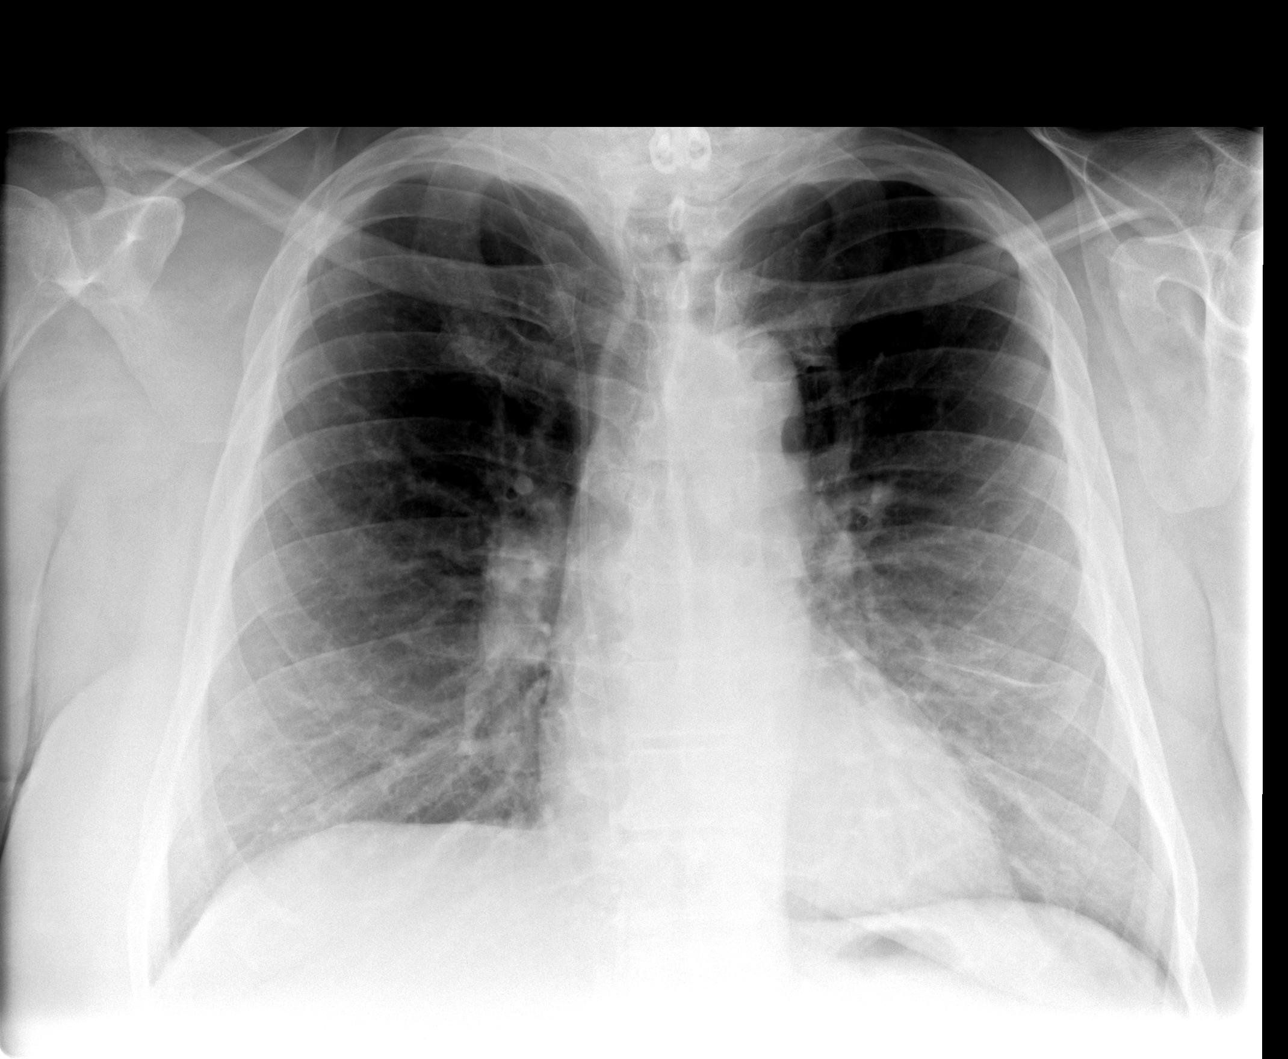

[view not recorded (2 of 3)]
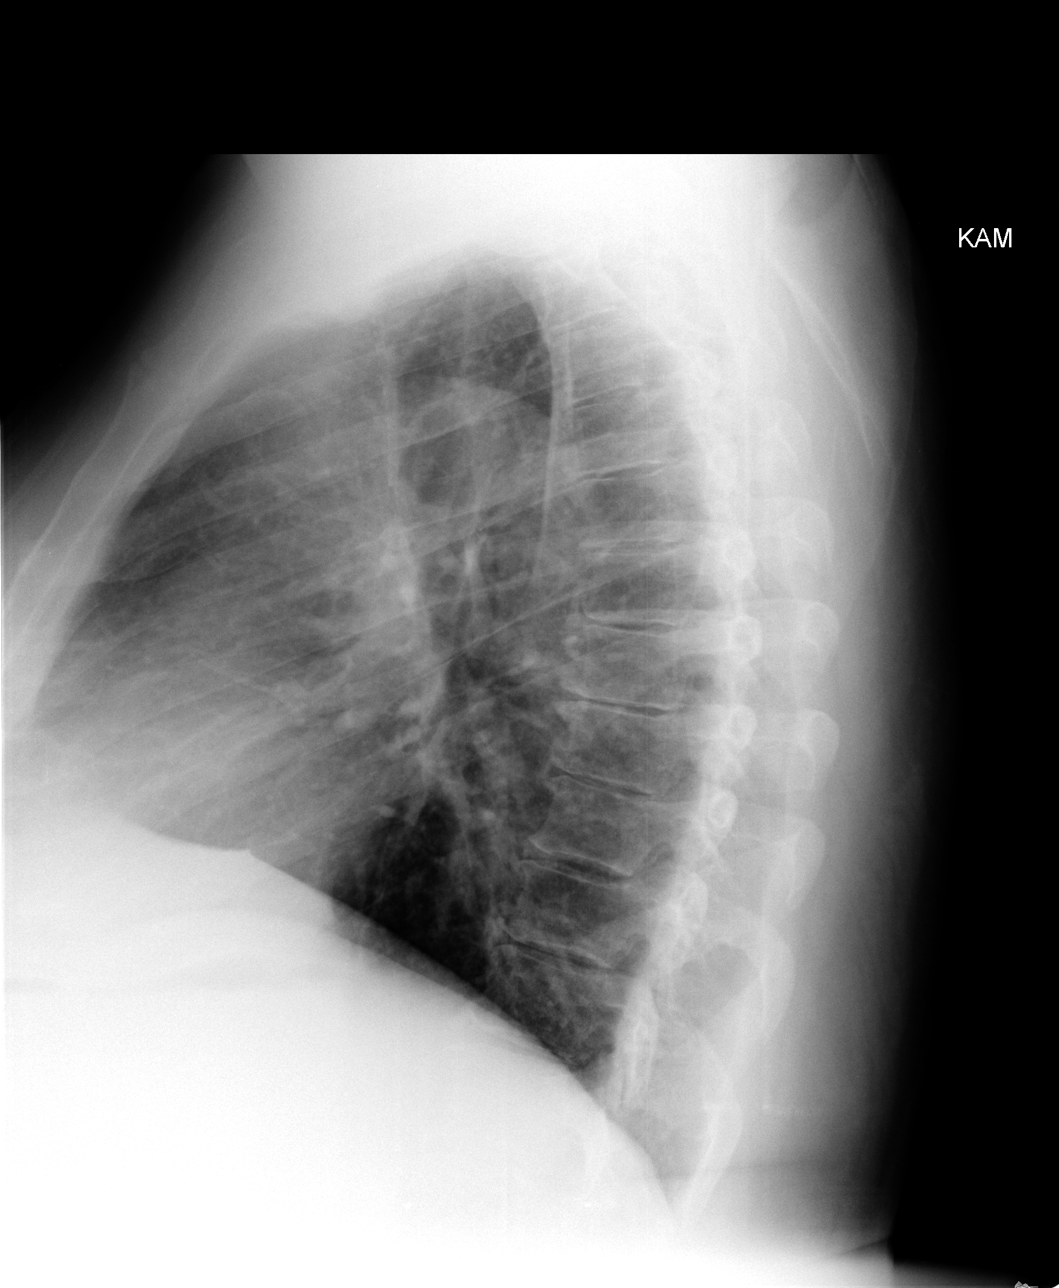

[view not recorded (3 of 3)]
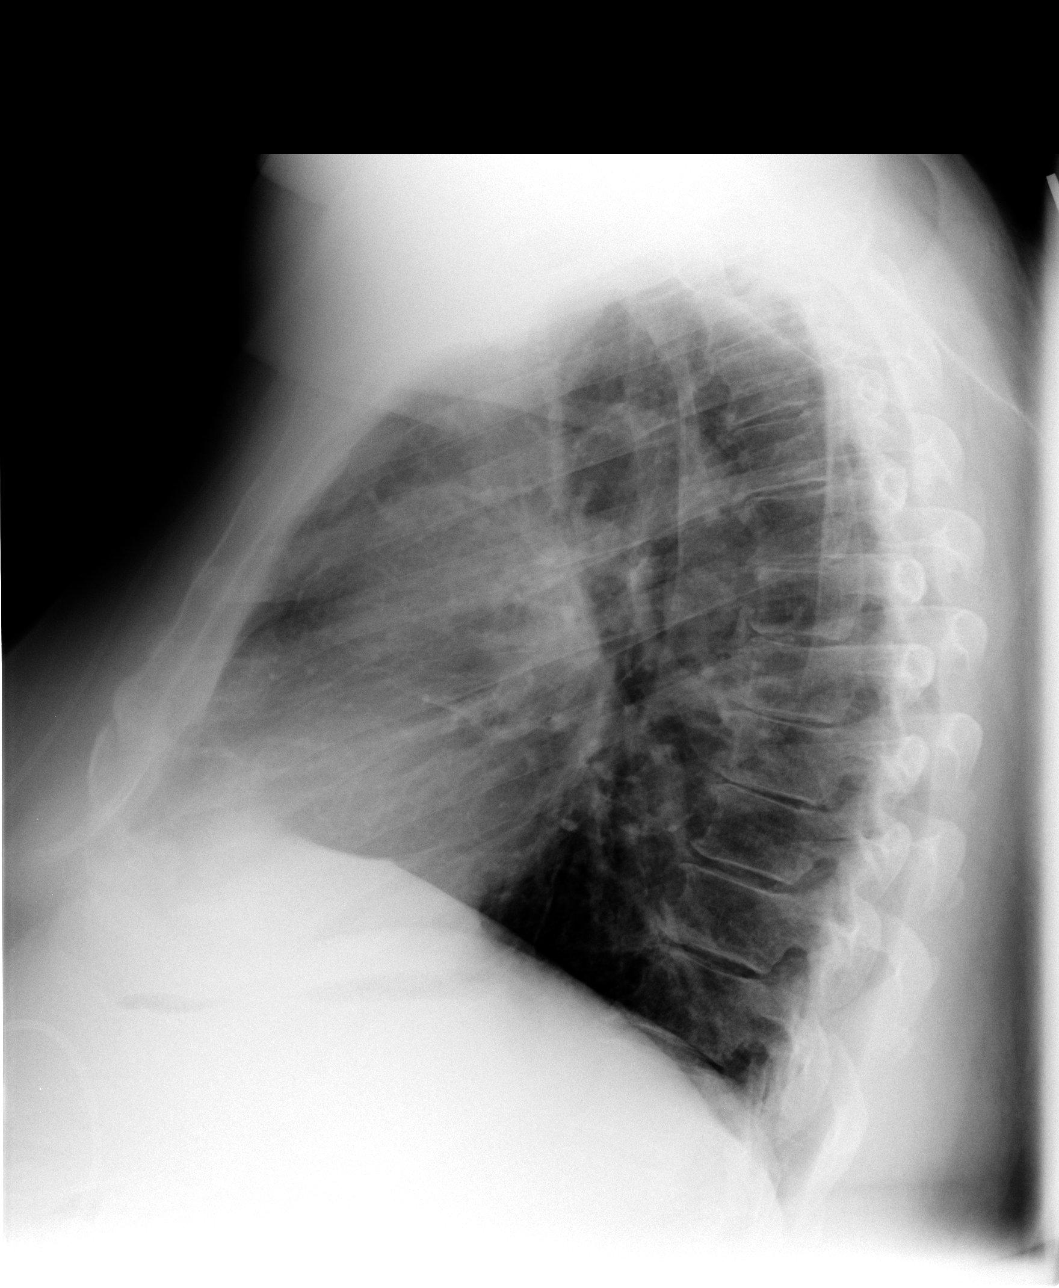

[3 of 3 positions shown; findings below may reference images not displayed]

FINDINGS: The cardiac silhouette, mediastinal and hilar contours
are within normal limits.  There are chronic bronchitic type lung
changes likely related to smoking or bronchitis.  Linear scarring
changes but no acute overlying pulmonary process.  No pleural
effusion.  The bony thorax is intact.
IMPRESSION: Chronic bronchitic type lung changes and areas of interstitial
scarring but no acute overlying pulmonary process.

## 2013-04-12 ENCOUNTER — Other Ambulatory Visit: Payer: Self-pay | Admitting: *Deleted

## 2013-04-12 ENCOUNTER — Ambulatory Visit (HOSPITAL_COMMUNITY)
Admission: RE | Admit: 2013-04-12 | Discharge: 2013-04-12 | Disposition: A | Discharge status: Home / Self Care | Payer: Medicare HMO | Source: Ambulatory Visit | Attending: Geriatric Medicine | Admitting: Geriatric Medicine

## 2013-04-12 ENCOUNTER — Other Ambulatory Visit (HOSPITAL_COMMUNITY): Payer: Self-pay | Admitting: Specialist

## 2013-04-12 DIAGNOSIS — I872 Venous insufficiency (chronic) (peripheral): Secondary | ICD-10-CM | POA: Insufficient documentation

## 2013-04-12 DIAGNOSIS — R609 Edema, unspecified: Secondary | ICD-10-CM

## 2013-04-12 DIAGNOSIS — E782 Mixed hyperlipidemia: Secondary | ICD-10-CM | POA: Insufficient documentation

## 2013-04-12 DIAGNOSIS — I83003 Varicose veins of unspecified lower extremity with ulcer of ankle: Secondary | ICD-10-CM

## 2013-04-12 DIAGNOSIS — M7989 Other specified soft tissue disorders: Secondary | ICD-10-CM

## 2013-04-12 NOTE — Progress Notes (Signed)
ABI Completed. Negative. Erlene Quan

## 2013-04-28 ENCOUNTER — Encounter (HOSPITAL_BASED_OUTPATIENT_CLINIC_OR_DEPARTMENT_OTHER): Payer: Medicare HMO | Attending: General Surgery

## 2013-04-28 DIAGNOSIS — I87309 Chronic venous hypertension (idiopathic) without complications of unspecified lower extremity: Secondary | ICD-10-CM | POA: Insufficient documentation

## 2013-06-02 ENCOUNTER — Encounter (HOSPITAL_BASED_OUTPATIENT_CLINIC_OR_DEPARTMENT_OTHER): Payer: Medicare HMO | Attending: General Surgery

## 2014-10-26 ENCOUNTER — Encounter (HOSPITAL_BASED_OUTPATIENT_CLINIC_OR_DEPARTMENT_OTHER): Payer: Self-pay | Admitting: Emergency Medicine

## 2014-10-26 ENCOUNTER — Emergency Department (HOSPITAL_BASED_OUTPATIENT_CLINIC_OR_DEPARTMENT_OTHER)
Admission: EM | Admit: 2014-10-26 | Discharge: 2014-10-26 | Disposition: A | Discharge status: Home / Self Care | Payer: Medicare HMO | Attending: Emergency Medicine | Admitting: Emergency Medicine

## 2014-10-26 DIAGNOSIS — Y9389 Activity, other specified: Secondary | ICD-10-CM | POA: Diagnosis not present

## 2014-10-26 DIAGNOSIS — Z79899 Other long term (current) drug therapy: Secondary | ICD-10-CM | POA: Diagnosis not present

## 2014-10-26 DIAGNOSIS — E119 Type 2 diabetes mellitus without complications: Secondary | ICD-10-CM | POA: Diagnosis not present

## 2014-10-26 DIAGNOSIS — S29012A Strain of muscle and tendon of back wall of thorax, initial encounter: Secondary | ICD-10-CM

## 2014-10-26 DIAGNOSIS — Z7952 Long term (current) use of systemic steroids: Secondary | ICD-10-CM | POA: Diagnosis not present

## 2014-10-26 DIAGNOSIS — I10 Essential (primary) hypertension: Secondary | ICD-10-CM | POA: Diagnosis not present

## 2014-10-26 DIAGNOSIS — W1839XA Other fall on same level, initial encounter: Secondary | ICD-10-CM | POA: Diagnosis not present

## 2014-10-26 DIAGNOSIS — Z87891 Personal history of nicotine dependence: Secondary | ICD-10-CM | POA: Diagnosis not present

## 2014-10-26 DIAGNOSIS — S3992XA Unspecified injury of lower back, initial encounter: Secondary | ICD-10-CM | POA: Diagnosis present

## 2014-10-26 DIAGNOSIS — Y9289 Other specified places as the place of occurrence of the external cause: Secondary | ICD-10-CM | POA: Insufficient documentation

## 2014-10-26 DIAGNOSIS — S39012A Strain of muscle, fascia and tendon of lower back, initial encounter: Secondary | ICD-10-CM | POA: Insufficient documentation

## 2014-10-26 DIAGNOSIS — J45909 Unspecified asthma, uncomplicated: Secondary | ICD-10-CM | POA: Insufficient documentation

## 2014-10-26 DIAGNOSIS — Z8669 Personal history of other diseases of the nervous system and sense organs: Secondary | ICD-10-CM | POA: Insufficient documentation

## 2014-10-26 DIAGNOSIS — Y998 Other external cause status: Secondary | ICD-10-CM | POA: Diagnosis not present

## 2014-10-26 MED ORDER — ORPHENADRINE CITRATE ER 100 MG PO TB12: 100.0000 mg | ORAL_TABLET | Freq: Two times a day (BID) | ORAL | Status: DC

## 2014-10-26 MED ORDER — CYCLOBENZAPRINE HCL 10 MG PO TABS
10.0000 mg | ORAL_TABLET | Freq: Once | ORAL | Status: AC
  Administered 2014-10-26: 10 mg via ORAL
  Filled 2014-10-26: qty 1

## 2014-10-26 NOTE — Discharge Instructions (Signed)
Take your osycodone-acetaminophen (Percocet) as needed for severe pain. Take plain acetaminophen for less severe pain.  Muscle Strain A muscle strain is an injury that occurs when a muscle is stretched beyond its normal length. Usually a small number of muscle fibers are torn when this happens. Muscle strain is rated in degrees. First-degree strains have the least amount of muscle fiber tearing and pain. Second-degree and third-degree strains have increasingly more tearing and pain.  Usually, recovery from muscle strain takes 1-2 weeks. Complete healing takes 5-6 weeks.  CAUSES  Muscle strain happens when a sudden, violent force placed on a muscle stretches it too far. This may occur with lifting, sports, or a fall.  RISK FACTORS Muscle strain is especially common in athletes.  SIGNS AND SYMPTOMS At the site of the muscle strain, there may be:  Pain.  Bruising.  Swelling.  Difficulty using the muscle due to pain or lack of normal function. DIAGNOSIS  Your health care provider will perform a physical exam and ask about your medical history. TREATMENT  Often, the best treatment for a muscle strain is resting, icing, and applying cold compresses to the injured area.  HOME CARE INSTRUCTIONS   Use the PRICE method of treatment to promote muscle healing during the first 2-3 days after your injury. The PRICE method involves:  Protecting the muscle from being injured again.  Restricting your activity and resting the injured body part.  Icing your injury. To do this, put ice in a plastic bag. Place a towel between your skin and the bag. Then, apply the ice and leave it on from 15-20 minutes each hour. After the third day, switch to moist heat packs.  Apply compression to the injured area with a splint or elastic bandage. Be careful not to wrap it too tightly. This may interfere with blood circulation or increase swelling.  Elevate the injured body part above the level of your heart as  often as you can.  Only take over-the-counter or prescription medicines for pain, discomfort, or fever as directed by your health care provider.  Warming up prior to exercise helps to prevent future muscle strains. SEEK MEDICAL CARE IF:   You have increasing pain or swelling in the injured area.  You have numbness, tingling, or a significant loss of strength in the injured area. MAKE SURE YOU:   Understand these instructions.  Will watch your condition.  Will get help right away if you are not doing well or get worse. Document Released: 09/14/2005 Document Revised: 07/05/2013 Document Reviewed: 04/13/2013 [facility] Patient Information 2015 [place], [place]. This information is not intended to replace advice given to you by your health care provider. Make sure you discuss any questions you have with your health care provider.   Orphenadrine tablets What is this medicine? ORPHENADRINE (or FEN a dreen) helps to relieve pain and stiffness in muscles and can treat muscle spasms. This medicine may be used for other purposes; ask your health care provider or pharmacist if you have questions. COMMON BRAND NAME(S): Norflex What should I tell my health care provider before I take this medicine? They need to know if you have any of these conditions: -glaucoma -heart disease -kidney disease -myasthenia gravis -peptic ulcer disease -prostate disease -stomach problems -an unusual or allergic reaction to orphenadrine, other medicines, foods, lactose, dyes, or preservatives -pregnant or trying to get pregnant -breast-feeding How should I use this medicine? Take this medicine by mouth with a full glass of water. Follow the directions on  the prescription label. Take your medicine at regular intervals. Do not take your medicine more often than directed. Do not take more than you are told to take. Talk to your pediatrician regarding the use of this medicine in children. Special care may be  needed. Patients over 60 years old may have a stronger reaction and need a smaller dose. Overdosage: If you think you have taken too much of this medicine contact a poison control center or emergency room at once. NOTE: This medicine is only for you. Do not share this medicine with others. What if I miss a dose? If you miss a dose, take it as soon as you can. If it is almost time for your next dose, take only that dose. Do not take double or extra doses. What may interact with this medicine? -alcohol -antihistamines -barbiturates, like phenobarbital -benzodiazepines -cyclobenzaprine -medicines for pain -phenothiazines like chlorpromazine, mesoridazine, prochlorperazine, thioridazine This list may not describe all possible interactions. Give your health care provider a list of all the medicines, herbs, non-prescription drugs, or dietary supplements you use. Also tell them if you smoke, drink alcohol, or use illegal drugs. Some items may interact with your medicine. What should I watch for while using this medicine? Your mouth may get dry. Chewing sugarless gum or sucking hard candy, and drinking plenty of water may help. Contact your doctor if the problem does not go away or is severe. This medicine may cause dry eyes and blurred vision. If you wear contact lenses you may feel some discomfort. Lubricating drops may help. See your eye doctor if the problem does not go away or is severe. You may get drowsy or dizzy. Do not drive, use machinery, or do anything that needs mental alertness until you know how this medicine affects you. Do not stand or sit up quickly, especially if you are an older patient. This reduces the risk of dizzy or fainting spells. Alcohol may interfere with the effect of this medicine. Avoid alcoholic drinks. What side effects may I notice from receiving this medicine? Side effects that you should report to your doctor or health care professional as soon as possible: -allergic  reactions like skin rash, itching or hives, swelling of the face, lips, or tongue -changes in vision -difficulty breathing -fast heartbeat or palpitations -hallucinations -light headedness, fainting spells -vomiting Side effects that usually do not require medical attention (report to your doctor or health care professional if they continue or are bothersome): -dizziness -drowsiness -headache -nausea This list may not describe all possible side effects. Call your doctor for medical advice about side effects. You may report side effects to FDA at 1-800-FDA-1088. Where should I keep my medicine? Keep out of the reach of children. Store at room temperature between 15 and 30 degrees C (59 and 86 degrees F). Protect from light. Keep container tightly closed. Throw away any unused medicine after the expiration date. NOTE: This sheet is a summary. It may not cover all possible information. If you have questions about this medicine, talk to your doctor, pharmacist, or health care provider.  2015, [employer]vier/[employer]. (2008-04-10 17:19:12)

## 2014-10-26 NOTE — ED Notes (Signed)
Pt c/o upper back pain, c/o bilateral shoulder pain since falling on ice last week,

## 2014-10-26 NOTE — ED Notes (Addendum)
Pt states fell on ice 6 days  2 days ago was working on steps  Took a nap  When woke had upper back pain

## 2014-10-26 NOTE — ED Provider Notes (Signed)
CSN: [id number]     Arrival date & time 10/26/14  3267 History   None    Chief Complaint  Patient presents with  . Back Pain     (Consider location/radiation/quality/duration/timing/severity/associated sxs/prior Treatment) Patient is a 59 y.o. male presenting with back pain. The history is provided by the patient.  Back Pain He is complaining of pain across his upper back, for the last 2 days. 6 days ago, he had slipped and fallen on some ice but felt okay after that. The ice made his steps collapsed and he had worked to repair this steps 2 days ago. That is when his upper back started hurting. Pain is worse on the left than on the right. He rates pain at 10/10. Pain is worse with movement such as trying to sit up from supine position. Nothing makes it any better. He denies any weakness, numbness, tingling. Denies bowel or bladder dysfunction. He has been taking acetaminophen without relief.  Past Medical History  Diagnosis Date  . Hypertension   . Diabetes mellitus type II   . Asthmatic bronchitis   . Sleep apnea   . Hydrocephalus     vp shunt   Past Surgical History  Procedure Laterality Date  . Ventriculoperitoneal shunt      x 2  . Tonsillectomy    . Neck fusion     Family History  Problem Relation Age of Onset  . Emphysema Father   . Cancer Mother     pancreatic  . Diabetes Mother   . Diabetes Father   . Diabetes Brother    History  Substance Use Topics  . Smoking status: Former Smoker -- 2.00 packs/day for 2 years    Types: Cigarettes    Quit date: 11/14/1984  . Smokeless tobacco: Never Used  . Alcohol Use: Yes     Comment: occasionally on weekends    Review of Systems  Musculoskeletal: Positive for back pain.  All other systems reviewed and are negative.     Allergies  Sulfa antibiotics  Home Medications   Prior to Admission medications   Medication Sig Start Date End Date Taking? Authorizing Provider  atenolol (TENORMIN) 100 MG tablet Take 100 mg  by mouth daily.   Yes Historical Provider, MD  buPROPion (WELLBUTRIN) 75 MG tablet Take 300 mg by mouth 2 (two) times daily.   Yes Historical Provider, MD  fenofibrate (TRICOR) 145 MG tablet Take 145 mg by mouth daily.   Yes Historical Provider, MD  furosemide (LASIX) 80 MG tablet Take 80 mg by mouth.   Yes Historical Provider, MD  hydrOXYzine (ATARAX/VISTARIL) 25 MG tablet Take 25 mg by mouth 3 (three) times daily as needed.   Yes Historical Provider, MD  losartan-hydrochlorothiazide (HYZAAR) 100-25 MG per tablet TAKE ONE TABLET BY MOUTH EVERY DAY 01/25/12  Yes Elsie Stain, MD  omeprazole (PRILOSEC) 20 MG capsule Take 20 mg by mouth daily.   Yes Historical Provider, MD  pioglitazone (ACTOS) 15 MG tablet Take 15 mg by mouth daily.   Yes Historical Provider, MD  spironolactone (ALDACTONE) 50 MG tablet Take 50 mg by mouth daily.   Yes Historical Provider, MD  traZODone (DESYREL) 100 MG tablet Take 100 mg by mouth at bedtime.   Yes Historical Provider, MD  albuterol (VENTOLIN HFA) 108 (90 BASE) MCG/ACT inhaler Inhale 2 puffs into the lungs 4 (four) times daily.      Historical Provider, MD  budesonide-formoterol (SYMBICORT) 160-4.5 MCG/ACT inhaler Inhale 2 puffs into the lungs  2 (two) times daily. 06/15/11 06/14/12  Elsie Stain, MD  Loratadine 10 MG CAPS Take 1 capsule by mouth daily.      Historical Provider, MD  metFORMIN (GLUCOPHAGE) 500 MG tablet Take 500 mg by mouth 2 (two) times daily.      Historical Provider, MD  predniSONE (DELTASONE) 10 MG tablet Take 4 for two days three for two days two for two days one for two days 06/15/11   Elsie Stain, MD  promethazine-codeine [facility] WITH CODEINE) 6.25-10 MG/5ML syrup 1 tsp every 4-6 hours as needed     Historical Provider, MD  tiotropium (SPIRIVA) 18 MCG inhalation capsule When current supply runs out, do NOT refill 06/15/11   Elsie Stain, MD   BP 113/68 mmHg  Pulse 67  Resp 20  Wt 322 lb (146.058 kg)  SpO2 100% Physical Exam   Nursing note and vitals reviewed.  59 year old male, resting comfortably and in no acute distress. Vital signs are normal. Oxygen saturation is 100%, which is normal. Head is normocephalic and atraumatic. PERRLA, EOMI. Oropharynx is clear. Neck is nontender and supple without adenopathy or JVD. Back is moderately tender in the left paraspinal area at the level of the scapula. There is no CVA tenderness. Lungs are clear without rales, wheezes, or rhonchi. Chest is nontender. Heart has regular rate and rhythm without murmur. Abdomen is soft, flat, nontender without masses or hepatosplenomegaly and peristalsis is normoactive. Extremities have 1+ edema, full range of motion is present. Mild venous stasis changes are present. Skin is warm and dry without rash. Neurologic: Mental status is normal, cranial nerves are intact, there are no motor or sensory deficits.  ED Course  Procedures (including critical care time)  MDM   Final diagnoses:  Upper back strain, initial encounter    Muscular strain of the upper back. Old records are reviewed and he has no visits for similar problems. His record on the [place] control substance reporting website was reviewed and he has been getting prescriptions for 90 oxycodone-acetaminophen 7.5-325 on a monthly basis for the last 2 months with last prescription having been filled on January 5. Prior to that, he had been getting monthly prescriptions for 90 hydrocodone-acetaminophen 10-325 tablets. I pointed this out to the patient and he brought a bag with all his medications but there was no narcotic prescriptions in the back that he brought. I have informed him that he should have medication left at home and he should use that for pain and that he would need to follow-up with his PCP to get additional narcotic prescriptions if needed. Of note, he states that he was told that he has some kidney problems and that he should not take any nonsteroidal  anti-inflammatory agents. He is discharged with prescription for orphenadrine and is to continue using acetaminophen for less severe pain. He is to make a follow-up appointment with his PCP.    Delora Fuel, MD 78/67/67 2094

## 2014-11-19 ENCOUNTER — Encounter (HOSPITAL_COMMUNITY): Payer: Self-pay

## 2014-11-19 ENCOUNTER — Emergency Department (INDEPENDENT_AMBULATORY_CARE_PROVIDER_SITE_OTHER)
Admission: EM | Admit: 2014-11-19 | Discharge: 2014-11-19 | Disposition: A | Discharge status: Home / Self Care | Payer: Medicare HMO | Source: Home / Self Care | Attending: Emergency Medicine | Admitting: Emergency Medicine

## 2014-11-19 DIAGNOSIS — M7522 Bicipital tendinitis, left shoulder: Secondary | ICD-10-CM | POA: Diagnosis not present

## 2014-11-19 DIAGNOSIS — I10 Essential (primary) hypertension: Secondary | ICD-10-CM | POA: Diagnosis not present

## 2014-11-19 LAB — POCT I-STAT, CHEM 8
BUN: 27 mg/dL — AB (ref 6–23)
CHLORIDE: 98 mmol/L (ref 96–112)
CREATININE: 1.2 mg/dL (ref 0.50–1.35)
Calcium, Ion: 1.19 mmol/L (ref 1.12–1.23)
Glucose, Bld: 107 mg/dL — ABNORMAL HIGH (ref 70–99)
HCT: 35 % — ABNORMAL LOW (ref 39.0–52.0)
Hemoglobin: 11.9 g/dL — ABNORMAL LOW (ref 13.0–17.0)
POTASSIUM: 3.3 mmol/L — AB (ref 3.5–5.1)
Sodium: 140 mmol/L (ref 135–145)
TCO2: 28 mmol/L (ref 0–100)

## 2014-11-19 MED ORDER — METHYLPREDNISOLONE ACETATE 80 MG/ML IJ SUSP
80.0000 mg | Freq: Once | INTRAMUSCULAR | Status: AC
  Administered 2014-11-19: 80 mg via INTRA_ARTICULAR

## 2014-11-19 MED ORDER — METHYLPREDNISOLONE ACETATE 40 MG/ML IJ SUSP
INTRAMUSCULAR | Status: AC
  Filled 2014-11-19: qty 1

## 2014-11-19 MED ORDER — LIDOCAINE HCL 2 % IJ SOLN
INTRAMUSCULAR | Status: AC
  Filled 2014-11-19: qty 20

## 2014-11-19 MED ORDER — HYDROCODONE-ACETAMINOPHEN 5-325 MG PO TABS: ORAL_TABLET | ORAL | Status: DC

## 2014-11-19 NOTE — Discharge Instructions (Signed)

## 2014-11-19 NOTE — ED Notes (Signed)
Provided w ice packs for use on his injection site in shoulder post AC joint proceedure

## 2014-11-19 NOTE — ED Provider Notes (Signed)
Chief Complaint   Shoulder Pain   History of Present Illness   Eric Houston is a 59 year old male with diabetes who has had a three-day history of left anterior shoulder pain which occurred after he fell asleep on a couch, lying on his left shoulder. He denies any other injury. There is no swelling or erythema. He denies any fever. He has pain with any type of movement of the shoulder.  Review of Systems   Other than as noted above, the patient denies any of the following symptoms: Systemic:  No fevers or chills. Musculoskeletal:  No joint pain, arthritis, swelling, back pain, or neck pain. No history of arthritis.  Neurological:  No muscular weakness or paresthesia.  [place]   Past medical history, family history, social history, meds, and allergies were reviewed.  He's allergic to Septra. He has diabetes and hydrocephalus. He takes albuterol, atenolol, Symbicort, Wellbutrin, TriCor, furosemide, hydroxyzine, loratadine, losartan/had her thiazide, metformin, omeprazole, Actos, spironolactone, Spiriva, and Desyrel.  Physical Examination     Vital signs:  BP 123/83 mmHg  Pulse 91  Temp(Src) 98 F (36.7 C) (Oral)  Resp 20  SpO2 96% Gen:  Alert and oriented times 3.  In no distress. Musculoskeletal: there is pain to palpation of the entire shoulder but most notably anterior. No swelling or erythema. Neer test was positive.  Hawkins test was positive.  Empty cans test was positive. Speed's test was positive.  Yergason sign was positive.  Otherwise, all joints had a full a ROM with no swelling, bruising or deformity.  No edema, pulses full. Extremities were warm and pink.  Capillary refill was brisk.  Skin:  Clear, warm and dry.  No rash. Neuro:  Alert and oriented times 3.  Muscle strength was normal.  Sensation was intact to light touch.   Procedure Note:  Verbal informed consent was obtained from the patient.  Risks and benefits were outlined with the patient.  Patient understands  and accepts these risks. A time out was called and the name of the procedure, the procedure site, and identity of the patient were confirmed verbally and by wristband.    The procedure was then performed as follows:  The anterior aspect of the shoulder was prepped with Betadine and alcohol and anesthetized with ethyl chloride spray. The bicipital tendon was identified, and 2 mL of Depo-Medrol 40 mg strength +3 mL of 2% Xylocaine were injected overlying the bicipital tendon. A Band-Aid dressing was applied. Patient tolerated this procedure well.  The patient tolerated the procedure well without any immediate complications.   Course in Urgent [place]   The following medications were given:  Medications  methylPREDNISolone acetate (DEPO-MEDROL) injection 80 mg (80 mg Intra-articular Given 11/19/14 1222)   Assessment   The encounter diagnosis was Bicipital tendonitis of left shoulder.  Plan     1.  Meds:  The following meds were prescribed:   Discharge Medication List as of 11/19/2014 12:34 PM    START taking these medications   Details  HYDROcodone-acetaminophen (NORCO/VICODIN) 5-325 MG per tablet 1 to 2 tabs every 4 to 6 hours as needed for pain., Print        2.  Patient Education/Counseling:  The patient was given appropriate handouts, self care instructions, and instructed in symptomatic relief.  Rest and ice for the next 3 days, then begin range of motion exercises.  3.  Follow up:  The patient was told to follow up here if no better in 3 to  4 days, or sooner if becoming worse in any way, and given some red flag symptoms such as worsening pain or new neurological symptoms which would prompt immediate return.  Follow-up with orthopedics if no better in one week.     Harden Mo, MD 11/19/14 [phone]

## 2014-11-19 NOTE — ED Provider Notes (Signed)
CSN: [id number]     Arrival date & time 11/19/14  1023 History   First MD Initiated Contact with Patient 11/19/14 1105     Chief Complaint  Patient presents with  . Shoulder Pain   (Consider location/radiation/quality/duration/timing/severity/associated sxs/prior Treatment) HPI Comments: Patient states he, by mistake, slept the whole night on his couch on the night of 11/17/2014 and woke on morning of 11/18/2014 with left shoulder pain. States he slept much of the night on his left shoulder and that it has remained painful with movement since. States he has been taking hydrocodone at home with little relief. No other reported injury or previous surgery.   Patient is a 59 y.o. male presenting with shoulder pain. The history is provided by the patient.  Shoulder Pain Location:  Shoulder (left) Time since incident:  24 hours Shoulder location:  L shoulder Associated symptoms: no back pain     Past Medical History  Diagnosis Date  . Hypertension   . Diabetes mellitus type II   . Asthmatic bronchitis   . Sleep apnea   . Hydrocephalus     vp shunt   Past Surgical History  Procedure Laterality Date  . Ventriculoperitoneal shunt      x 2  . Tonsillectomy    . Neck fusion     Family History  Problem Relation Age of Onset  . Emphysema Father   . Cancer Mother     pancreatic  . Diabetes Mother   . Diabetes Father   . Diabetes Brother    History  Substance Use Topics  . Smoking status: Former Smoker -- 2.00 packs/day for 2 years    Types: Cigarettes    Quit date: 11/14/1984  . Smokeless tobacco: Never Used  . Alcohol Use: Yes     Comment: occasionally on weekends    Review of Systems  Constitutional: Negative.   HENT: Negative.   Respiratory: Negative.   Cardiovascular: Negative.   Gastrointestinal: Negative.   Musculoskeletal: Negative for back pain.    Allergies  Sulfa antibiotics  Home Medications   Prior to Admission medications   Medication Sig Start Date  End Date Taking? Authorizing Provider  albuterol (VENTOLIN HFA) 108 (90 BASE) MCG/ACT inhaler Inhale 2 puffs into the lungs 4 (four) times daily.      Historical Provider, MD  atenolol (TENORMIN) 100 MG tablet Take 100 mg by mouth daily.    Historical Provider, MD  budesonide-formoterol (SYMBICORT) 160-4.5 MCG/ACT inhaler Inhale 2 puffs into the lungs 2 (two) times daily. 06/15/11 06/14/12  Elsie Stain, MD  buPROPion (WELLBUTRIN) 75 MG tablet Take 300 mg by mouth 2 (two) times daily.    Historical Provider, MD  fenofibrate (TRICOR) 145 MG tablet Take 145 mg by mouth daily.    Historical Provider, MD  furosemide (LASIX) 80 MG tablet Take 80 mg by mouth.    Historical Provider, MD  HYDROcodone-acetaminophen (NORCO/VICODIN) 5-325 MG per tablet 1 to 2 tabs every 4 to 6 hours as needed for pain. 11/19/14   Harden Mo, MD  hydrOXYzine (ATARAX/VISTARIL) 25 MG tablet Take 25 mg by mouth 3 (three) times daily as needed.    Historical Provider, MD  Loratadine 10 MG CAPS Take 1 capsule by mouth daily.      Historical Provider, MD  losartan-hydrochlorothiazide (HYZAAR) 100-25 MG per tablet TAKE ONE TABLET BY MOUTH EVERY DAY 01/25/12   Elsie Stain, MD  metFORMIN (GLUCOPHAGE) 500 MG tablet Take 500 mg by mouth 2 (two) times  daily.      Historical Provider, MD  omeprazole (PRILOSEC) 20 MG capsule Take 20 mg by mouth daily.    Historical Provider, MD  orphenadrine (NORFLEX) 100 MG tablet Take 1 tablet (100 mg total) by mouth 2 (two) times daily. 3/61/44   Delora Fuel, MD  pioglitazone (ACTOS) 15 MG tablet Take 15 mg by mouth daily.    Historical Provider, MD  predniSONE (DELTASONE) 10 MG tablet Take 4 for two days three for two days two for two days one for two days 06/15/11   Elsie Stain, MD  promethazine-codeine [facility] WITH CODEINE) 6.25-10 MG/5ML syrup 1 tsp every 4-6 hours as needed     Historical Provider, MD  spironolactone (ALDACTONE) 50 MG tablet Take 50 mg by mouth daily.    Historical  Provider, MD  tiotropium (SPIRIVA) 18 MCG inhalation capsule When current supply runs out, do NOT refill 06/15/11   Elsie Stain, MD  traZODone (DESYREL) 100 MG tablet Take 100 mg by mouth at bedtime.    Historical Provider, MD   BP 123/83 mmHg  Pulse 91  Temp(Src) 98 F (36.7 C) (Oral)  Resp 20  SpO2 96% Physical Exam  Constitutional: He is oriented to person, place, and time. He appears well-developed and well-nourished.  HENT:  Head: Normocephalic and atraumatic.  Cardiovascular: Normal rate, regular rhythm and normal heart sounds.   Pulmonary/Chest: Effort normal and breath sounds normal. No respiratory distress. He has no wheezes.  Musculoskeletal:       Left shoulder: He exhibits decreased range of motion, tenderness and pain. He exhibits no bony tenderness, no swelling, no effusion, no crepitus, no deformity, no laceration, no spasm, normal pulse and normal strength.       Arms: CSM exam of remainder of LUE intact  Neurological: He is alert and oriented to person, place, and time.  Skin: Skin is warm and dry.  Psychiatric: He has a normal mood and affect. His behavior is normal.  Nursing note and vitals reviewed.   ED Course  Procedures (including critical care time) Labs Review Labs Reviewed  POCT I-STAT, CHEM 8 - Abnormal; Notable for the following:    Potassium 3.3 (*)    BUN 27 (*)    Glucose, Bld 107 (*)    Hemoglobin 11.9 (*)    HCT 35.0 (*)    All other components within normal limits    Imaging Review No results found.   MDM   1. Bicipital tendonitis of left shoulder   Case discussed with Dr. Kennon Holter. Patient unable to take NSAID medications as these have caused him acute renal insufficiency in the past. He is currently a type 2 diabetic and would likely develop hyperglycemia from oral steroid taper. Intraarticular steroid injection performed by Dr. Jake Michaelis at bedside in hope that this will quiet shoulder bursitis and if this does not, patient has been  advised to follow up with orthopedist.  ECG: NSR at 86 bpm without acute ST/T wave changes. No ectopy. Normal intervals. No previous tracings available for comparison.    Lutricia Feil, [place] 11/19/14 1256

## 2014-11-19 NOTE — ED Notes (Addendum)
States he fell asleep in couch a couple of days, and since then, has had a lot of pain in his left shoulder. Was reportedly told some time ago that he has a tear in the ligaments in his shoulder. Denies new injury or trauma to the affected shoulder. Good pulse, distal to pain area. W/d/color good

## 2015-09-24 ENCOUNTER — Emergency Department (HOSPITAL_COMMUNITY): Payer: Medicare HMO

## 2015-09-24 ENCOUNTER — Inpatient Hospital Stay (HOSPITAL_COMMUNITY)
Admission: EM | Admit: 2015-09-24 | Discharge: 2015-09-26 | DRG: 191 | Disposition: A | Discharge status: Home Health | Payer: Medicare HMO | Attending: Internal Medicine | Admitting: Internal Medicine

## 2015-09-24 ENCOUNTER — Encounter (HOSPITAL_COMMUNITY): Payer: Self-pay | Admitting: General Practice

## 2015-09-24 DIAGNOSIS — G4730 Sleep apnea, unspecified: Secondary | ICD-10-CM | POA: Diagnosis present

## 2015-09-24 DIAGNOSIS — J4521 Mild intermittent asthma with (acute) exacerbation: Secondary | ICD-10-CM

## 2015-09-24 DIAGNOSIS — R0600 Dyspnea, unspecified: Secondary | ICD-10-CM | POA: Diagnosis present

## 2015-09-24 DIAGNOSIS — I872 Venous insufficiency (chronic) (peripheral): Secondary | ICD-10-CM | POA: Diagnosis present

## 2015-09-24 DIAGNOSIS — Z794 Long term (current) use of insulin: Secondary | ICD-10-CM

## 2015-09-24 DIAGNOSIS — Z6841 Body Mass Index (BMI) 40.0 and over, adult: Secondary | ICD-10-CM | POA: Diagnosis not present

## 2015-09-24 DIAGNOSIS — Z982 Presence of cerebrospinal fluid drainage device: Secondary | ICD-10-CM | POA: Diagnosis not present

## 2015-09-24 DIAGNOSIS — E119 Type 2 diabetes mellitus without complications: Secondary | ICD-10-CM | POA: Insufficient documentation

## 2015-09-24 DIAGNOSIS — R0902 Hypoxemia: Secondary | ICD-10-CM | POA: Diagnosis present

## 2015-09-24 DIAGNOSIS — E11649 Type 2 diabetes mellitus with hypoglycemia without coma: Secondary | ICD-10-CM | POA: Diagnosis present

## 2015-09-24 DIAGNOSIS — E669 Obesity, unspecified: Secondary | ICD-10-CM | POA: Diagnosis present

## 2015-09-24 DIAGNOSIS — G919 Hydrocephalus, unspecified: Secondary | ICD-10-CM | POA: Diagnosis present

## 2015-09-24 DIAGNOSIS — R0602 Shortness of breath: Secondary | ICD-10-CM | POA: Diagnosis not present

## 2015-09-24 DIAGNOSIS — J45909 Unspecified asthma, uncomplicated: Secondary | ICD-10-CM

## 2015-09-24 DIAGNOSIS — J441 Chronic obstructive pulmonary disease with (acute) exacerbation: Secondary | ICD-10-CM | POA: Diagnosis not present

## 2015-09-24 DIAGNOSIS — I10 Essential (primary) hypertension: Secondary | ICD-10-CM | POA: Diagnosis present

## 2015-09-24 DIAGNOSIS — J069 Acute upper respiratory infection, unspecified: Secondary | ICD-10-CM | POA: Diagnosis present

## 2015-09-24 DIAGNOSIS — Z7984 Long term (current) use of oral hypoglycemic drugs: Secondary | ICD-10-CM

## 2015-09-24 DIAGNOSIS — E1165 Type 2 diabetes mellitus with hyperglycemia: Secondary | ICD-10-CM | POA: Insufficient documentation

## 2015-09-24 DIAGNOSIS — R062 Wheezing: Secondary | ICD-10-CM

## 2015-09-24 DIAGNOSIS — Z87891 Personal history of nicotine dependence: Secondary | ICD-10-CM | POA: Diagnosis not present

## 2015-09-24 DIAGNOSIS — Z79899 Other long term (current) drug therapy: Secondary | ICD-10-CM | POA: Diagnosis not present

## 2015-09-24 HISTORY — DX: Reserved for inherently not codable concepts without codable children: IMO0001

## 2015-09-24 HISTORY — DX: Chronic obstructive pulmonary disease, unspecified: J44.9

## 2015-09-24 HISTORY — DX: Unspecified osteoarthritis, unspecified site: M19.90

## 2015-09-24 HISTORY — DX: Headache: R51

## 2015-09-24 HISTORY — DX: Headache, unspecified: R51.9

## 2015-09-24 LAB — CBC WITH DIFFERENTIAL/PLATELET
BASOS ABS: 0 10*3/uL (ref 0.0–0.1)
BASOS PCT: 0 %
EOS PCT: 3 %
Eosinophils Absolute: 0.2 10*3/uL (ref 0.0–0.7)
HCT: 36 % — ABNORMAL LOW (ref 39.0–52.0)
Hemoglobin: 12 g/dL — ABNORMAL LOW (ref 13.0–17.0)
LYMPHS PCT: 25 %
Lymphs Abs: 1.7 10*3/uL (ref 0.7–4.0)
MCH: 30.3 pg (ref 26.0–34.0)
MCHC: 33.3 g/dL (ref 30.0–36.0)
MCV: 90.9 fL (ref 78.0–100.0)
Monocytes Absolute: 0.6 10*3/uL (ref 0.1–1.0)
Monocytes Relative: 9 %
NEUTROS ABS: 4.2 10*3/uL (ref 1.7–7.7)
Neutrophils Relative %: 63 %
PLATELETS: 217 10*3/uL (ref 150–400)
RBC: 3.96 MIL/uL — AB (ref 4.22–5.81)
RDW: 13.5 % (ref 11.5–15.5)
WBC: 6.7 10*3/uL (ref 4.0–10.5)

## 2015-09-24 LAB — COMPREHENSIVE METABOLIC PANEL
ALBUMIN: 2.9 g/dL — AB (ref 3.5–5.0)
ALT: 21 U/L (ref 17–63)
AST: 19 U/L (ref 15–41)
Alkaline Phosphatase: 60 U/L (ref 38–126)
Anion gap: 8 (ref 5–15)
BUN: 14 mg/dL (ref 6–20)
CHLORIDE: 104 mmol/L (ref 101–111)
CO2: 29 mmol/L (ref 22–32)
CREATININE: 1 mg/dL (ref 0.61–1.24)
Calcium: 8.7 mg/dL — ABNORMAL LOW (ref 8.9–10.3)
GFR calc Af Amer: >60 mL/min (ref >60)
GLUCOSE: 107 mg/dL — AB (ref 65–99)
Potassium: 3.6 mmol/L (ref 3.5–5.1)
Sodium: 141 mmol/L (ref 135–145)
Total Bilirubin: 0.6 mg/dL (ref 0.3–1.2)
Total Protein: 5.9 g/dL — ABNORMAL LOW (ref 6.5–8.1)

## 2015-09-24 LAB — I-STAT CG4 LACTIC ACID, ED: LACTIC ACID, VENOUS: 1.09 mmol/L (ref 0.5–2.0)

## 2015-09-24 LAB — GLUCOSE, CAPILLARY
GLUCOSE-CAPILLARY: 164 mg/dL — AB (ref 65–99)
GLUCOSE-CAPILLARY: 184 mg/dL — AB (ref 65–99)
GLUCOSE-CAPILLARY: 175 mg/dL — AB (ref 65–99)

## 2015-09-24 MED ORDER — ALBUTEROL SULFATE (2.5 MG/3ML) 0.083% IN NEBU
5.0000 mg | INHALATION_SOLUTION | Freq: Once | RESPIRATORY_TRACT | Status: AC
  Administered 2015-09-24: 5 mg via RESPIRATORY_TRACT
  Filled 2015-09-24: qty 6

## 2015-09-24 MED ORDER — INSULIN ASPART 100 UNIT/ML ~~LOC~~ SOLN
0.0000 [IU] | Freq: Three times a day (TID) | SUBCUTANEOUS | Status: DC
  Administered 2015-09-24 – 2015-09-25 (×2): 4 [IU] via SUBCUTANEOUS
  Administered 2015-09-26: 3 [IU] via SUBCUTANEOUS

## 2015-09-24 MED ORDER — BISACODYL 5 MG PO TBEC: 5.0000 mg | DELAYED_RELEASE_TABLET | Freq: Every day | ORAL | Status: DC | PRN

## 2015-09-24 MED ORDER — POTASSIUM CHLORIDE CRYS ER 20 MEQ PO TBCR
20.0000 meq | EXTENDED_RELEASE_TABLET | Freq: Every day | ORAL | Status: DC
  Administered 2015-09-24 – 2015-09-26 (×3): 20 meq via ORAL
  Filled 2015-09-24 (×4): qty 1

## 2015-09-24 MED ORDER — INFLUENZA VAC SPLIT QUAD 0.5 ML IM SUSY
0.5000 mL | PREFILLED_SYRINGE | INTRAMUSCULAR | Status: DC
Start: 2015-09-25 — End: 2015-09-26
  Filled 2015-09-24: qty 0.5

## 2015-09-24 MED ORDER — FUROSEMIDE 80 MG PO TABS
80.0000 mg | ORAL_TABLET | Freq: Every day | ORAL | Status: DC
  Administered 2015-09-24 – 2015-09-26 (×3): 80 mg via ORAL
  Filled 2015-09-24 (×3): qty 1

## 2015-09-24 MED ORDER — GUAIFENESIN-DM 100-10 MG/5ML PO SYRP
5.0000 mL | ORAL_SOLUTION | ORAL | Status: DC | PRN
  Administered 2015-09-26: 5 mL via ORAL
  Filled 2015-09-24: qty 5

## 2015-09-24 MED ORDER — METHYLPREDNISOLONE SODIUM SUCC 125 MG IJ SOLR
125.0000 mg | Freq: Once | INTRAMUSCULAR | Status: AC
  Administered 2015-09-24: 125 mg via INTRAVENOUS
  Filled 2015-09-24: qty 2

## 2015-09-24 MED ORDER — ONDANSETRON HCL 4 MG PO TABS: 4.0000 mg | ORAL_TABLET | Freq: Four times a day (QID) | ORAL | Status: DC | PRN

## 2015-09-24 MED ORDER — PREDNISONE 50 MG PO TABS
60.0000 mg | ORAL_TABLET | Freq: Every day | ORAL | Status: DC
  Administered 2015-09-25 – 2015-09-26 (×2): 60 mg via ORAL
  Filled 2015-09-24 (×2): qty 1

## 2015-09-24 MED ORDER — HYDROCHLOROTHIAZIDE 12.5 MG PO CAPS
12.5000 mg | ORAL_CAPSULE | Freq: Every day | ORAL | Status: DC
  Administered 2015-09-24 – 2015-09-26 (×3): 12.5 mg via ORAL
  Filled 2015-09-24 (×3): qty 1

## 2015-09-24 MED ORDER — PNEUMOCOCCAL VAC POLYVALENT 25 MCG/0.5ML IJ INJ
0.5000 mL | INJECTION | INTRAMUSCULAR | Status: DC
Start: 2015-09-25 — End: 2015-09-26
  Filled 2015-09-24: qty 0.5

## 2015-09-24 MED ORDER — PANTOPRAZOLE SODIUM 40 MG PO TBEC
40.0000 mg | DELAYED_RELEASE_TABLET | Freq: Every day | ORAL | Status: DC
  Administered 2015-09-24 – 2015-09-26 (×3): 40 mg via ORAL
  Filled 2015-09-24 (×3): qty 1

## 2015-09-24 MED ORDER — LOSARTAN POTASSIUM-HCTZ 50-12.5 MG PO TABS: 1.0000 | ORAL_TABLET | Freq: Every day | ORAL | Status: DC

## 2015-09-24 MED ORDER — ENOXAPARIN SODIUM 40 MG/0.4ML ~~LOC~~ SOLN
40.0000 mg | SUBCUTANEOUS | Status: DC
  Administered 2015-09-24 – 2015-09-25 (×2): 40 mg via SUBCUTANEOUS
  Filled 2015-09-24 (×2): qty 0.4

## 2015-09-24 MED ORDER — LOSARTAN POTASSIUM 50 MG PO TABS
50.0000 mg | ORAL_TABLET | Freq: Every day | ORAL | Status: DC
  Administered 2015-09-24 – 2015-09-26 (×3): 50 mg via ORAL
  Filled 2015-09-24 (×3): qty 1

## 2015-09-24 MED ORDER — ACETAMINOPHEN 325 MG PO TABS: 650.0000 mg | ORAL_TABLET | Freq: Four times a day (QID) | ORAL | Status: DC | PRN

## 2015-09-24 MED ORDER — ACETAMINOPHEN 650 MG RE SUPP: 650.0000 mg | Freq: Four times a day (QID) | RECTAL | Status: DC | PRN

## 2015-09-24 MED ORDER — IPRATROPIUM-ALBUTEROL 0.5-2.5 (3) MG/3ML IN SOLN
3.0000 mL | RESPIRATORY_TRACT | Status: DC | PRN
  Administered 2015-09-25: 3 mL via RESPIRATORY_TRACT
  Filled 2015-09-24: qty 3

## 2015-09-24 MED ORDER — ONDANSETRON HCL 4 MG/2ML IJ SOLN
4.0000 mg | Freq: Four times a day (QID) | INTRAMUSCULAR | Status: DC | PRN
Start: 2015-09-24 — End: 2015-09-26

## 2015-09-24 MED ORDER — SODIUM CHLORIDE 0.9 % IV BOLUS (SEPSIS)
500.0000 mL | Freq: Once | INTRAVENOUS | Status: AC
  Administered 2015-09-24: 500 mL via INTRAVENOUS

## 2015-09-24 MED ORDER — IPRATROPIUM BROMIDE 0.02 % IN SOLN
0.5000 mg | Freq: Once | RESPIRATORY_TRACT | Status: AC
  Administered 2015-09-24: 0.5 mg via RESPIRATORY_TRACT
  Filled 2015-09-24: qty 2.5

## 2015-09-24 NOTE — ED Notes (Signed)
Pt in from home c/o SOB onset x 1 wk ago, went to PCP & rcvd abx, pt reports completing the course, pt presents today with increased SOB & labored breathing, denies n/v, c/o x 2 diarrhea episodes, pt states, "I get diarrhea everytime I take antibiotics." pt reports cp with coughing, A&O x4

## 2015-09-24 NOTE — H&P (Signed)
[facility] History and Physical  Eric Houston [id number] DOB: [birth year] DOA: 09/24/2015  Referring physician: Emergency Department PCP: Garnette Czech, MD   CHIEF COMPLAINT:   shortness of breath       HPI: Eric Houston is a 59 y.o. male with history of former tobacco use, asthma, sleep apnea, hypertension and diabetes. Patient states he has had asthma since childhood but has never required hospitalization. He uses daily inhalers . Patient saw PCP on Friday for shortness breath and wheezing. He was given an injection and 5 days of antibiotics but patient does not know the name of either.  Symptoms did not improve. Shortness of breath worse when moving about. Chest feels tight but no significant chest pain. No orthopnea. Patient has developed a cough productive of clear sputum. He complains of chills.  Patient takes Lasix at home. He weighs himself about once a week and weight is been stable. Tries to follow a low-sodium diet at home.  Patient has sleep apnea but doesn't use CPAP a regular basis   ED COURSE:   Received steroids and nebulizers without significant improvement in wheezing or dyspnea  Labs:   Lactic acid 1.09, WBC 6.7, hgb 12  CXR:    No acute abnormalities         EKG:    Sinus rhythm Borderline right axis deviation No significant change since last tracing QT 432                   Medications  albuterol (PROVENTIL) (2.5 MG/3ML) 0.083% nebulizer solution 5 mg (5 mg Nebulization Given 09/24/15 0916)  sodium chloride 0.9 % bolus 500 mL (500 mLs Intravenous New Bag/Given 09/24/15 0944)  albuterol (PROVENTIL) (2.5 MG/3ML) 0.083% nebulizer solution 5 mg (5 mg Nebulization Given 09/24/15 1012)  ipratropium (ATROVENT) nebulizer solution 0.5 mg (0.5 mg Nebulization Given 09/24/15 1012)  methylPREDNISolone sodium succinate (SOLU-MEDROL) 125 mg/2 mL injection 125 mg (125 mg Intravenous Given 09/24/15 0944)    Review of Systems  Constitutional: Positive  for chills.  HENT: Negative.   Eyes: Negative.   Respiratory: Positive for cough, sputum production, shortness of breath and wheezing.   Cardiovascular: Positive for leg swelling.  Gastrointestinal: Negative.   Genitourinary: Negative.   Musculoskeletal: Negative.   Skin: Negative.   Neurological: Negative.   Endo/Heme/Allergies: Negative.   Psychiatric/Behavioral: Negative.    Past Medical History  Diagnosis Date  . Hypertension   . Diabetes mellitus type II   . Asthmatic bronchitis   . Sleep apnea   . Hydrocephalus     vp shunt   Past Surgical History  Procedure Laterality Date  . Ventriculoperitoneal shunt      x 2  . Tonsillectomy    . Neck fusion      SOCIAL HISTORY:  reports that he quit smoking about 30 years ago. His smoking use included Cigarettes. He has a 4 pack-year smoking history. He has never used smokeless tobacco. He reports that he drinks alcohol. He reports that he does not use illicit drugs. Lives: at alone home   Assistive devices:   None needed for ambulation.   Allergies  Allergen Reactions  . Sulfa Antibiotics     rash    Family History  Problem Relation Age of Onset  . Emphysema Father   . Cancer Mother     pancreatic  . Diabetes Mother   . Diabetes Father   . Diabetes Brother     Prior to Admission medications   Medication  Sig Start Date End Date Taking? Authorizing Provider  albuterol (VENTOLIN HFA) 108 (90 BASE) MCG/ACT inhaler Inhale 2 puffs into the lungs 4 (four) times daily.      Historical Provider, MD  atenolol (TENORMIN) 100 MG tablet Take 100 mg by mouth daily.    Historical Provider, MD  budesonide-formoterol (SYMBICORT) 160-4.5 MCG/ACT inhaler Inhale 2 puffs into the lungs 2 (two) times daily. 06/15/11 06/14/12  Elsie Stain, MD  buPROPion (WELLBUTRIN) 75 MG tablet Take 300 mg by mouth 2 (two) times daily.    Historical Provider, MD  fenofibrate (TRICOR) 145 MG tablet Take 145 mg by mouth daily.    Historical Provider, MD    furosemide (LASIX) 80 MG tablet Take 80 mg by mouth.    Historical Provider, MD  HYDROcodone-acetaminophen (NORCO/VICODIN) 5-325 MG per tablet 1 to 2 tabs every 4 to 6 hours as needed for pain. 11/19/14   Harden Mo, MD  hydrOXYzine (ATARAX/VISTARIL) 25 MG tablet Take 25 mg by mouth 3 (three) times daily as needed.    Historical Provider, MD  Loratadine 10 MG CAPS Take 1 capsule by mouth daily.      Historical Provider, MD  losartan-hydrochlorothiazide (HYZAAR) 100-25 MG per tablet TAKE ONE TABLET BY MOUTH EVERY DAY 01/25/12   Elsie Stain, MD  metFORMIN (GLUCOPHAGE) 500 MG tablet Take 500 mg by mouth 2 (two) times daily.      Historical Provider, MD  omeprazole (PRILOSEC) 20 MG capsule Take 20 mg by mouth daily.    Historical Provider, MD  orphenadrine (NORFLEX) 100 MG tablet Take 1 tablet (100 mg total) by mouth 2 (two) times daily. [id number]   Delora Fuel, MD  pioglitazone (ACTOS) 15 MG tablet Take 15 mg by mouth daily.    Historical Provider, MD  predniSONE (DELTASONE) 10 MG tablet Take 4 for two days three for two days two for two days one for two days 06/15/11   Elsie Stain, MD  promethazine-codeine [facility] WITH CODEINE) 6.25-10 MG/5ML syrup 1 tsp every 4-6 hours as needed     Historical Provider, MD  spironolactone (ALDACTONE) 50 MG tablet Take 50 mg by mouth daily.    Historical Provider, MD  tiotropium (SPIRIVA) 18 MCG inhalation capsule When current supply runs out, do NOT refill 06/15/11   Elsie Stain, MD  traZODone (DESYREL) 100 MG tablet Take 100 mg by mouth at bedtime.    Historical Provider, MD   PHYSICAL EXAM: Filed Vitals:   09/24/15 1115 09/24/15 1130 09/24/15 1131 09/24/15 1221  BP: 162/82 156/72  150/69  Pulse: 72  67 75  Resp: 20  20 18   Height:      Weight:      SpO2: 93%  95% 94%    Wt Readings from Last 3 Encounters:  09/24/15 138.347 kg (305 lb)  10/26/14 146.058 kg (322 lb)  06/15/11 146.421 kg (322 lb 12.8 oz)    General:  Pleasant obese  white male. Appears calm and comfortable Eyes: PER, normal lids, irises & conjunctiva ENT: grossly normal hearing, lips & tongue Neck: no LAD, no masses Cardiovascular: RRR, no murmurs, 2+ BLE edema.  Respiratory: Respirations even and unlabored. Respirations slightly labored, rhonchi and expiratory wheezing in bilateral upper and lower lobes Abdomen: soft, obese ,non-tender, active bowel sounds. No obvious masses.  Skin: no rash seen on limited exam Musculoskeletal: grossly normal tone BUE/BLE Psychiatric: grossly normal mood and affect, speech fluent and appropriate Neurologic: grossly non-focal.  LABS ON ADMISSION:    Basic Metabolic Panel:  Recent Labs Lab 09/24/15 0939  NA 141  K 3.6  CL 104  CO2 29  GLUCOSE 107*  BUN 14  CREATININE 1.00  CALCIUM 8.7*   Liver Function Tests:  Recent Labs Lab 09/24/15 0939  AST 19  ALT 21  ALKPHOS 60  BILITOT 0.6  PROT 5.9*  ALBUMIN 2.9*   CBC:  Recent Labs Lab 09/24/15 0939  WBC 6.7  NEUTROABS 4.2  HGB 12.0*  HCT 36.0*  MCV 90.9  PLT 217   CREATININE: 1 (09/24/15 0939) Estimated creatinine clearance - 104 mL/min  Radiological Exams on Admission: Dg Chest 2 View  09/24/2015  CLINICAL DATA:  Two week history of shortness of breath with cough and congestion EXAM: CHEST  2 VIEW COMPARISON:  June 21, 2012 FINDINGS: There is mild scarring in the left lower lobe. There is no edema or consolidation. Heart is upper normal in size with pulmonary vascularity within normal limits. No adenopathy. Shunt catheter extends along the medial anterior right hemi- thorax. There is degenerative change in the thoracic spine. IMPRESSION: No edema or consolidation. Heart upper normal in size. Slight scarring left lower lobe. Electronically Signed   By: Lowella Grip III M.D.   On: 09/24/2015 10:12    ASSESSMENT / PLAN   Dyspnea, cough, and wheezing with underlying asthma / chronic airway obstruction. Maintained on albuterol  inhaler at home. No acute abnormalities on chest x-ray. Suspect COPD exacerbation. -admit to Medical bed -Duonebs Q 4 hours - prednisone to 60mg  Qam x 4 additional days.   -robitussin DM prn cough -If not improving, consider adding antibiotics  Hypertension. Controlled at present -Continue home medications. Will administer today as patient has not had them yet.   Peripheral edema, +2. On diuretics at home. No evidence for heart failure on CXR. Limited information in Epic, no echocardiogram available. Unknown if he has history of CHF but takes daily lasix. He is on actos which can exacerbate heart failure.    Diabetes mellitus, type 2. Glucose 107 today -Hold home oral diabetic agents -Monitor CBGs and start sliding scale insulin -resistant for obesity plus steroids -hold metformin and actos    Sleep apnea. Not using CPAP on a regular basis Ask respiratory therapy to see for CPAP  Hydrocephalus. Shunt in place  CONSULTANTS:   none  Code Status: Full code DVT Prophylaxis: Lovenox  Family Communication:  Patient alert, oriented and understands plan of care.   Disposition Plan: Discharge to home in 2-3 days   Time spent: 60 minutes Tye Savoy  NP [facility] Pager [phone]

## 2015-09-24 NOTE — ED Provider Notes (Signed)
CSN: [id number]     Arrival date & time 09/24/15  [id number] History   First MD Initiated Contact with Patient 09/24/15 [phone]     Chief Complaint  Patient presents with  . Shortness of Breath     (Consider location/radiation/quality/duration/timing/severity/associated sxs/prior Treatment) Patient is a 59 y.o. male presenting with shortness of breath. The history is provided by the patient.  Shortness of Breath Associated symptoms: cough, fever, sore throat and wheezing   Associated symptoms: no abdominal pain, no chest pain, no headaches, no neck pain, no rash and no vomiting   Patient indicates hx 'asthmatic brochitis', with increased productive cough/clear in the past week, with increased wheezing and sob.  ?subjective fevers, +chills.  Mild scratchy/sore throat and runny nose. No sinus pain. Pt cant recall if had flu shot this year. Saw pcp w same last week, was placed on abx, ?zithromax, but no better. Now feels increased wheezing and sob. No chest pain except with hard cough. No leg pain or swelling. No known ill contacts. Pt notes couple loose/diarrhea stools in the past day. No vomiting.       Past Medical History  Diagnosis Date  . Hypertension   . Diabetes mellitus type II   . Asthmatic bronchitis   . Sleep apnea   . Hydrocephalus     vp shunt   Past Surgical History  Procedure Laterality Date  . Ventriculoperitoneal shunt      x 2  . Tonsillectomy    . Neck fusion     Family History  Problem Relation Age of Onset  . Emphysema Father   . Cancer Mother     pancreatic  . Diabetes Mother   . Diabetes Father   . Diabetes Brother    Social History  Substance Use Topics  . Smoking status: Former Smoker -- 2.00 packs/day for 2 years    Types: Cigarettes    Quit date: 11/14/1984  . Smokeless tobacco: Never Used  . Alcohol Use: Yes     Comment: occasionally on weekends    Review of Systems  Constitutional: Positive for fever and chills.  HENT: Positive for  congestion, rhinorrhea and sore throat.   Eyes: Negative for redness.  Respiratory: Positive for cough, shortness of breath and wheezing.   Cardiovascular: Negative for chest pain and leg swelling.  Gastrointestinal: Negative for vomiting and abdominal pain.  Genitourinary: Negative for flank pain.  Musculoskeletal: Negative for back pain and neck pain.  Skin: Negative for rash.  Neurological: Negative for headaches.  Hematological: Does not bruise/bleed easily.  Psychiatric/Behavioral: Negative for confusion.      Allergies  Sulfa antibiotics  Home Medications   Prior to Admission medications   Medication Sig Start Date End Date Taking? Authorizing Provider  albuterol (VENTOLIN HFA) 108 (90 BASE) MCG/ACT inhaler Inhale 2 puffs into the lungs 4 (four) times daily.      Historical Provider, MD  atenolol (TENORMIN) 100 MG tablet Take 100 mg by mouth daily.    Historical Provider, MD  budesonide-formoterol (SYMBICORT) 160-4.5 MCG/ACT inhaler Inhale 2 puffs into the lungs 2 (two) times daily. 06/15/11 06/14/12  Elsie Stain, MD  buPROPion (WELLBUTRIN) 75 MG tablet Take 300 mg by mouth 2 (two) times daily.    Historical Provider, MD  fenofibrate (TRICOR) 145 MG tablet Take 145 mg by mouth daily.    Historical Provider, MD  furosemide (LASIX) 80 MG tablet Take 80 mg by mouth.    Historical Provider, MD  HYDROcodone-acetaminophen (NORCO/VICODIN) 5-325 MG  per tablet 1 to 2 tabs every 4 to 6 hours as needed for pain. 11/19/14   Harden Mo, MD  hydrOXYzine (ATARAX/VISTARIL) 25 MG tablet Take 25 mg by mouth 3 (three) times daily as needed.    Historical Provider, MD  Loratadine 10 MG CAPS Take 1 capsule by mouth daily.      Historical Provider, MD  losartan-hydrochlorothiazide (HYZAAR) 100-25 MG per tablet TAKE ONE TABLET BY MOUTH EVERY DAY 01/25/12   Elsie Stain, MD  metFORMIN (GLUCOPHAGE) 500 MG tablet Take 500 mg by mouth 2 (two) times daily.      Historical Provider, MD  omeprazole  (PRILOSEC) 20 MG capsule Take 20 mg by mouth daily.    Historical Provider, MD  orphenadrine (NORFLEX) 100 MG tablet Take 1 tablet (100 mg total) by mouth 2 (two) times daily. [id number]   Delora Fuel, MD  pioglitazone (ACTOS) 15 MG tablet Take 15 mg by mouth daily.    Historical Provider, MD  predniSONE (DELTASONE) 10 MG tablet Take 4 for two days three for two days two for two days one for two days 06/15/11   Elsie Stain, MD  promethazine-codeine [facility] WITH CODEINE) 6.25-10 MG/5ML syrup 1 tsp every 4-6 hours as needed     Historical Provider, MD  spironolactone (ALDACTONE) 50 MG tablet Take 50 mg by mouth daily.    Historical Provider, MD  tiotropium (SPIRIVA) 18 MCG inhalation capsule When current supply runs out, do NOT refill 06/15/11   Elsie Stain, MD  traZODone (DESYREL) 100 MG tablet Take 100 mg by mouth at bedtime.    Historical Provider, MD   BP 152/68 mmHg  Pulse 66  Resp 26  Ht 5\' 6"  ([id number] m)  Wt 138.347 kg  BMI 49.25 kg/m2  SpO2 96% Physical Exam  Constitutional: He is oriented to person, place, and time. He appears well-developed and well-nourished. No distress.  HENT:  Nose: Nose normal.  Mouth/Throat: Oropharynx is clear and moist. No oropharyngeal exudate.  Eyes: Conjunctivae are normal. Pupils are equal, round, and reactive to light. No scleral icterus.  Neck: Neck supple. No tracheal deviation present.  No stiffness or rigidity  Cardiovascular: Normal rate, regular rhythm, normal heart sounds and intact distal pulses.  Exam reveals no gallop and no friction rub.   No murmur heard. Pulmonary/Chest: Effort normal. No accessory muscle usage. No respiratory distress. He has wheezes.  Abdominal: Soft. Bowel sounds are normal. He exhibits no distension. There is no tenderness.  Musculoskeletal: Normal range of motion. He exhibits no edema or tenderness.  Neurological: He is alert and oriented to person, place, and time.  Skin: Skin is warm and dry. No rash noted.  He is not diaphoretic.  Psychiatric: He has a normal mood and affect.  Nursing note and vitals reviewed.   ED Course  Procedures (including critical care time) Labs Review  Results for orders placed or performed during the hospital encounter of 09/24/15  CBC with Differential/Platelet  Result Value Ref Range   WBC 6.7 4.0 - 10.5 K/uL   RBC 3.96 (L) 4.22 - 5.81 MIL/uL   Hemoglobin 12.0 (L) 13.0 - 17.0 g/dL   HCT 36.0 (L) 39.0 - 52.0 %   MCV 90.9 78.0 - 100.0 fL   MCH 30.3 26.0 - 34.0 pg   MCHC 33.3 30.0 - 36.0 g/dL   RDW 13.5 11.5 - 15.5 %   Platelets 217 150 - 400 K/uL   Neutrophils Relative % 63 %   Neutro  Abs 4.2 1.7 - 7.7 K/uL   Lymphocytes Relative 25 %   Lymphs Abs 1.7 0.7 - 4.0 K/uL   Monocytes Relative 9 %   Monocytes Absolute 0.6 0.1 - 1.0 K/uL   Eosinophils Relative 3 %   Eosinophils Absolute 0.2 0.0 - 0.7 K/uL   Basophils Relative 0 %   Basophils Absolute 0.0 0.0 - 0.1 K/uL  Comprehensive metabolic panel  Result Value Ref Range   Sodium 141 135 - 145 mmol/L   Potassium 3.6 3.5 - 5.1 mmol/L   Chloride 104 101 - 111 mmol/L   CO2 29 22 - 32 mmol/L   Glucose, Bld 107 (H) 65 - 99 mg/dL   BUN 14 6 - 20 mg/dL   Creatinine, Ser 1.00 0.61 - 1.24 mg/dL   Calcium 8.7 (L) 8.9 - 10.3 mg/dL   Total Protein 5.9 (L) 6.5 - 8.1 g/dL   Albumin 2.9 (L) 3.5 - 5.0 g/dL   AST 19 15 - 41 U/L   ALT 21 17 - 63 U/L   Alkaline Phosphatase 60 38 - 126 U/L   Total Bilirubin 0.6 0.3 - 1.2 mg/dL   GFR calc non Af Amer >60 >60 mL/min   GFR calc Af Amer >60 >60 mL/min   Anion gap 8 5 - 15  I-Stat CG4 Lactic Acid, ED  Result Value Ref Range   Lactic Acid, Venous 1.09 0.5 - 2.0 mmol/L   Dg Chest 2 View  09/24/2015  CLINICAL DATA:  Two week history of shortness of breath with cough and congestion EXAM: CHEST  2 VIEW COMPARISON:  June 21, 2012 FINDINGS: There is mild scarring in the left lower lobe. There is no edema or consolidation. Heart is upper normal in size with pulmonary  vascularity within normal limits. No adenopathy. Shunt catheter extends along the medial anterior right hemi- thorax. There is degenerative change in the thoracic spine. IMPRESSION: No edema or consolidation. Heart upper normal in size. Slight scarring left lower lobe. Electronically Signed   By: Lowella Grip III M.D.   On: 09/24/2015 10:12       I have personally reviewed and evaluated these images and lab results as part of my medical decision-making.   EKG Interpretation   Date/Time:  Tuesday September 24 2015 09:07:29 EST Ventricular Rate:  71 PR Interval:  163 QRS Duration: 95 QT Interval:  398 QTC Calculation: 432 R Axis:   89 Text Interpretation:  Sinus rhythm Borderline right axis deviation No  significant change since last tracing Confirmed by Ashok Cordia  MD, Lennette Bihari  ([id number]) on 09/24/2015 9:21:54 AM      MDM   Albuterol and atrovent neb. Cxr.  Reviewed nursing notes and prior charts for additional history.   Recheck, persistent wheezing.   Additional albuterol and atrovent nebs. Solumedrol.  Persistent wheezing.  Will admit to med service re asthma/copd exacerbation, uri.       Lajean Saver, MD 09/24/15 [phone]

## 2015-09-25 DIAGNOSIS — J441 Chronic obstructive pulmonary disease with (acute) exacerbation: Principal | ICD-10-CM

## 2015-09-25 LAB — CBC
HCT: 37.7 % — ABNORMAL LOW (ref 39.0–52.0)
HEMOGLOBIN: 12.6 g/dL — AB (ref 13.0–17.0)
MCH: 30 pg (ref 26.0–34.0)
MCHC: 33.4 g/dL (ref 30.0–36.0)
MCV: 89.8 fL (ref 78.0–100.0)
Platelets: 267 10*3/uL (ref 150–400)
RBC: 4.2 MIL/uL — ABNORMAL LOW (ref 4.22–5.81)
RDW: 13.7 % (ref 11.5–15.5)
WBC: 12 10*3/uL — ABNORMAL HIGH (ref 4.0–10.5)

## 2015-09-25 LAB — GLUCOSE, CAPILLARY
Glucose-Capillary: 96 mg/dL (ref 65–99)
Glucose-Capillary: 118 mg/dL — ABNORMAL HIGH (ref 65–99)
Glucose-Capillary: 174 mg/dL — ABNORMAL HIGH (ref 65–99)
Glucose-Capillary: 96 mg/dL (ref 65–99)

## 2015-09-25 LAB — BASIC METABOLIC PANEL
ANION GAP: 12 (ref 5–15)
BUN: 16 mg/dL (ref 6–20)
CO2: 26 mmol/L (ref 22–32)
Calcium: 9.1 mg/dL (ref 8.9–10.3)
Chloride: 101 mmol/L (ref 101–111)
Creatinine, Ser: 1 mg/dL (ref 0.61–1.24)
GFR calc Af Amer: >60 mL/min (ref >60)
GLUCOSE: 114 mg/dL — AB (ref 65–99)
POTASSIUM: 4 mmol/L (ref 3.5–5.1)
Sodium: 139 mmol/L (ref 135–145)

## 2015-09-25 MED ORDER — BUDESONIDE 0.25 MG/2ML IN SUSP
0.2500 mg | Freq: Two times a day (BID) | RESPIRATORY_TRACT | Status: DC
  Administered 2015-09-25 – 2015-09-26 (×2): 0.25 mg via RESPIRATORY_TRACT
  Filled 2015-09-25 (×2): qty 2

## 2015-09-25 NOTE — Progress Notes (Addendum)
Occupational Therapy Evaluation Patient Details Name: Eric Houston MRN: [id number] DOB: December 13, 1955 Today's Date: 09/25/2015    History of Present Illness Pt with a Past Medical History of HTN, DM, Asthma, OCA, hydrocephalus who presents with acute respiratory distress   Clinical Impression   Pt admitted with the above diagnoses and presents with below problem list. Pt will benefit from continued acute OT to address the below listed deficits and maximize independence with BADLs prior to d/c home. PTA pt was independent to mod I with ADLs. Pt is currently mod I with most ADLs, supervision with tub transfers. Session details below. Introduced Market researcher, plan to continue that education and practice tub transfers next session. Pt on RA during session with O2 91-93 with household distance functional mobility. Some DOE noted towards end of session with pt recovering after about a minute or two in seated position. OT to continue to follow acutely.      Follow Up Recommendations  Supervision - Intermittent;No OT follow up;Other (comment) (shower transfers)    Equipment Recommendations  None recommended by OT    Recommendations for Other Services PT consult     Precautions / Restrictions Precautions Precautions: Other (comment) (moniter O2) Restrictions Weight Bearing Restrictions: No      Mobility Bed Mobility                  Transfers Overall transfer level: Modified independent Equipment used: None             General transfer comment: extra time, effort    Balance Overall balance assessment: No apparent balance deficits (not formally assessed) (picked paper off floor with no swaying or LOB.)                                          ADL Overall ADL's : Modified independent (supervision for tub shower transfers) Eating/Feeding: Modified independent;Sitting   Grooming: Modified independent;Standing   Upper Body  Bathing: Modified independent   Lower Body Bathing: Modified independent   Upper Body Dressing : Modified independent   Lower Body Dressing: Modified independent   Toilet Transfer: Modified Independent;Comfort height toilet;Grab bars   Toileting- Clothing Manipulation and Hygiene: Modified independent   Tub/ Shower Transfer: Supervision/safety;Tub transfer;Ambulation;Grab bars   Functional mobility during ADLs: Modified independent General ADL Comments: Pt completed household distance functional mobility on RA with O2 ranging 91-93. Pt reports he typically uses support of furniture at home to walk around, mod I with LB dressing/bathing at baseline. Educated on energy conservation strategies and safety with home setup. Educated on breathing techniques. Pt with some DOE, recovered in a timely manner.      Vision     Perception     Praxis      Pertinent Vitals/Pain Pain Assessment: No/denies pain     Hand Dominance     Extremity/Trunk Assessment Upper Extremity Assessment Upper Extremity Assessment: Overall WFL for tasks assessed   Lower Extremity Assessment Lower Extremity Assessment: Defer to PT evaluation       Communication Communication Communication: No difficulties   Cognition Arousal/Alertness: Awake/alert Behavior During Therapy: WFL for tasks assessed/performed Overall Cognitive Status: Within Functional Limits for tasks assessed                     General Comments       Exercises       Shoulder  Instructions      Home Living Family/patient expects to be discharged to:: Private residence Living Arrangements: Alone Available Help at Discharge: Family;Available PRN/intermittently;Other (Comment) (son lives just down the road from pt) Type of Home: House Home Access: Stairs to enter [employer] of Steps: 4 Entrance Stairs-Rails: None (son plans to install rails in near future) Home Layout: One level     Bathroom Shower/Tub:  Teacher, early years/pre: Standard     Home Equipment: Shower seat;Grab bars - tub/shower;Cane - single point          Prior Functioning/Environment Level of Independence: Independent        Comments: on disability from work injury to neck, drives, goes out for breakfast    OT Diagnosis: Generalized weakness   OT Problem List: Decreased activity tolerance;Decreased knowledge of use of DME or AE;Decreased knowledge of precautions;Obesity   OT Treatment/Interventions: Self-care/ADL training;Energy conservation;DME and/or AE instruction;Therapeutic activities;Patient/family education    OT Goals(Current goals can be found in the care plan section) Acute Rehab OT Goals Patient Stated Goal: not stated OT Goal Formulation: With patient Time For Goal Achievement: 10/02/15 Potential to Achieve Goals: Good ADL Goals Pt Will Perform Tub/Shower Transfer: Tub transfer;with modified independence;shower seat Additional ADL Goal #1: Pt will independently verbalize 2 energy conservation strategies for use during ADLs.  OT Frequency: Min 1X/week   Barriers to D/C:            Co-evaluation              End of Session    Activity Tolerance: Patient tolerated treatment well;Other (comment) (some DOE noted, recovered in sitting position ~1 minute) Patient left: in chair;with call bell/phone within reach   Time: 1251-1307 OT Time Calculation (min): 16 min Charges:  OT General Charges $OT Visit: 1 Procedure OT Evaluation $Initial OT Evaluation Tier I: 1 Procedure G-Codes:    Hortencia Pilar 09/28/15, 1:25 PM

## 2015-09-25 NOTE — Care Management Note (Signed)
Case Management Note  Patient Details  Name: Eric Houston MRN: [id number] Date of Birth: 1955-10-11  Subjective/Objective:                    Action/Plan: 09-25-15 MD COPD Gold Protocol ordered , do you want a home health RN for COPD management? Thanks Magdalen Spatz RN BSN [phone]   Expected Discharge Date:                  Expected Discharge Plan:     In-House Referral:     Discharge planning Services     Post Acute Care Choice:    Choice offered to:     DME Arranged:    DME Agency:     HH Arranged:    HH Agency:     Status of Service:  In process, will continue to follow  Medicare Important Message Given:    Date Medicare IM Given:    Medicare IM give by:    Date Additional Medicare IM Given:    Additional Medicare Important Message give by:     If discussed at [place] of Stay Meetings, dates discussed:    Additional Comments:  Marilu Favre, RN 09/25/2015, 9:55 AM

## 2015-09-25 NOTE — Progress Notes (Signed)
Nutrition Brief Note  RD consulted to assess nutrient needs and status (COPD GOLD protocol).  Wt Readings from Last 15 Encounters:  09/24/15 305 lb (138.347 kg)  10/26/14 322 lb (146.058 kg)  06/15/11 322 lb 12.8 oz (146.421 kg)  10/03/08 302 lb (136.986 kg)   Eric Houston is a 59 y.o. male with a Past Medical History of HTN, DM, Asthma, OCA, hydrocephalus who presents with acute respiratory distress.   Spoke with pt at bedside. He reports good appetite both presently and PTA. He reveals that he "eats a lot" and typical diet consists of 3 meals per day and a few snacks. Breakfast consists of eggs, toast, bacon, and sausage from [place] is a barbeque sandwich, and dinner is a burger and fries. Pt admits that he eats out frequently,as he lives by himself, but is contemplating eating at home more often to assist with his chronic conditions.   Pt reports he takes Actos as his DM regimen. CGS run well, usually in the 90's per his report.   Pt is interested in learning more about his diet to help him manage his health conditions. Discussed ways pt could limit salt, fat, and carbohydrates in his diet and gave examples of more healthful options when eating out as well as simple meals to prepare. Also provided "Carbohydrate Counting With Diabetes" and "Heart Healthy Nutrition Therapy" handouts from [facility] Nutrition Care Manual. RD contact information provided.   Body mass index is 49.25 kg/(m^2). Patient meets criteria for extreme obesity, class III based on current BMI.   Current diet order is Heart Healthy/ Carb Modified, patient is consuming approximately 100% of meals at this time. Labs and medications reviewed.   No nutrition interventions warranted at this time. If nutrition issues arise, please consult RD.   Quinesha Selinger A. Jimmye Norman, RD, LDN, CDE Pager: [phone] After hours Pager: [phone]

## 2015-09-25 NOTE — Care Management Note (Signed)
Case Management Note  Patient Details  Name: DAYSHON BEEKMAN MRN: [id number] Date of Birth: [birth year]  Subjective/Objective:                    Action/Plan:   Expected Discharge Date:                  Expected Discharge Plan:  [place]  In-House Referral:     Discharge planning Services  CM Consult  Post Acute Care Choice:  Home Health Choice offered to:  Patient  DME Arranged:    DME Agency:     HH Arranged:    [place] Agency:  [place]  Status of Service:  Completed, signed off  Medicare Important Message Given:    Date Medicare IM Given:    Medicare IM give by:    Date Additional Medicare IM Given:    Additional Medicare Important Message give by:     If discussed at [place] of Stay Meetings, dates discussed:    Additional Comments:  Marilu Favre, RN 09/25/2015, 2:58 PM

## 2015-09-25 NOTE — Progress Notes (Signed)
[facility] PROGRESS NOTE  Eric Houston [id number] DOB: 05/12/1956 DOA: 09/24/2015 PCP: Harvie Junior, MD  Assessment/Plan: Dyspnea, cough, and wheezing with underlying asthma / chronic airway obstruction.  -Was using just albuterol at home -No acute abnormalities on chest x-ray.  -will need outpatient follow up with pulmonary service for formal PFT's and adjustment on maintenance regimen -Duonebs Q 4 hours and will add pulmicort -will start flutter valve -prednisone to 60mg    -robitussin DM prn cough  Hypertension. Controlled at present -Continue home medications.   Peripheral edema, On diuretics at home. No evidence for heart failure on CXR.  -patient with hx of venous insufficiency  -will continue TED hoses -continue home diuretic regimen and low sodium diet   Diabetes mellitus, type 2. Glucose 107 today -will continue holding oral diabetic agents while inpatient -will monitor CBGs and continue sliding scale insulin; will adjust as needed base on CBG's fluctuation  -expecting elevated values, due to steroids    Sleep apnea. Not using CPAP on a regular basis -resp therapy alerted to help with CPAP while inpatient   Hydrocephalus. Shunt in place -no HA's, no blurred vision   Code Status: Full code Family Communication: no family at bedside  Disposition Plan: home in the next 24-48 hours; waiting further improvement in his breathing    Consultants:  None   Procedures:  See below for x-ray reports   Antibiotics:  None   HPI/Subjective: No CP, no fever, still wheezing and with mild difficulty speaking in full sentences   Objective: Filed Vitals:   09/25/15 1026 09/25/15 1300  BP: 161/73 153/85  Pulse: 60 67  Temp:  97.7 F (36.5 C)  Resp:  17   No intake or output data in the 24 hours ending 09/25/15 1741 Filed Weights   09/24/15 0859 09/24/15 1622  Weight: 138.347 kg (305 lb) 138.347 kg (305 lb)    Exam:   General:   Afebrile, breathing better and w/o CP  Cardiovascular: S1 and S2, no rubs or gallops  Respiratory: improve air movement, exp wheezing on exam; no use of accessory muscles. No Crackles   Abdomen:obese, soft, NT, ND, positive BS   Musculoskeletal: TED's in place, no cyanosis   Data Reviewed: Basic Metabolic Panel:  Recent Labs Lab 09/24/15 0939 09/25/15 0553  NA 141 139  K 3.6 4.0  CL 104 101  CO2 29 26  GLUCOSE 107* 114*  BUN 14 16  CREATININE 1.00 1.00  CALCIUM 8.7* 9.1   Liver Function Tests:  Recent Labs Lab 09/24/15 0939  AST 19  ALT 21  ALKPHOS 60  BILITOT 0.6  PROT 5.9*  ALBUMIN 2.9*   CBC:  Recent Labs Lab 09/24/15 0939 09/25/15 0553  WBC 6.7 12.0*  NEUTROABS 4.2  --   HGB 12.0* 12.6*  HCT 36.0* 37.7*  MCV 90.9 89.8  PLT 217 267   CBG:  Recent Labs Lab 09/24/15 1752 09/24/15 1838 09/24/15 2142 09/25/15 0823 09/25/15 1224  GLUCAP 164* 184* 175* 96 118*   Studies: Dg Chest 2 View  09/24/2015  CLINICAL DATA:  Two week history of shortness of breath with cough and congestion EXAM: CHEST  2 VIEW COMPARISON:  June 21, 2012 FINDINGS: There is mild scarring in the left lower lobe. There is no edema or consolidation. Heart is upper normal in size with pulmonary vascularity within normal limits. No adenopathy. Shunt catheter extends along the medial anterior right hemi- thorax. There is degenerative change in the thoracic spine. IMPRESSION: No  edema or consolidation. Heart upper normal in size. Slight scarring left lower lobe. Electronically Signed   By: Lowella Grip III M.D.   On: 09/24/2015 10:12    Scheduled Meds: . enoxaparin (LOVENOX) injection  40 mg Subcutaneous Q24H  . furosemide  80 mg Oral Daily  . losartan  50 mg Oral Daily   And  . hydrochlorothiazide  12.5 mg Oral Daily  . Influenza vac split quadrivalent PF  0.5 mL Intramuscular Tomorrow-1000  . insulin aspart  0-20 Units Subcutaneous TID WC  . pantoprazole  40 mg Oral  Daily  . pneumococcal 23 valent vaccine  0.5 mL Intramuscular Tomorrow-1000  . potassium chloride  20 mEq Oral Daily  . predniSONE  60 mg Oral Q breakfast   Continuous Infusions:   Active Problems:   Hypertension   Diabetes mellitus, type 2 ([place])   Sleep apnea   Hydrocephalus   Dyspnea   Asthma   COPD exacerbation ([place])    Time spent: 35 minutes    Barton Dubois  [facility] Pager [phone]. If 7PM-7AM, please contact night-coverage at [email or web address], password [facility] 09/25/2015, 5:41 PM  LOS: 1 day

## 2015-09-25 NOTE — Progress Notes (Addendum)
PT Cancellation Note/Discharge  Patient Details Name: PINKNEY LISSNER MRN: [id number] DOB: Jun 03, 1956   Cancelled Treatment:    Reason Eval/Treat Not Completed: PT screened, no needs identified, will sign off.  Pt up and moving around his room independently.  He worked with OT earlier today (see OT eval/plan of care).  I agree with OT plan and pt is on board with only have one therapist come to see him to work on energy conservation strategies.  PT to defer further therapy to acute OT.  No further PT needs identified at this time.  Pt is agreeable to this plan and hopes to be able to d/c home early tomorrow.     Barbarann Ehlers Hart Haas, PT, DPT [phone]   09/25/2015, 2:52 PM

## 2015-09-26 DIAGNOSIS — E1165 Type 2 diabetes mellitus with hyperglycemia: Secondary | ICD-10-CM | POA: Insufficient documentation

## 2015-09-26 LAB — GLUCOSE, CAPILLARY
GLUCOSE-CAPILLARY: 87 mg/dL (ref 65–99)
Glucose-Capillary: 150 mg/dL — ABNORMAL HIGH (ref 65–99)
Glucose-Capillary: 108 mg/dL — ABNORMAL HIGH (ref 65–99)

## 2015-09-26 MED ORDER — PREDNISONE 20 MG PO TABS: ORAL_TABLET | ORAL | Status: DC

## 2015-09-26 MED ORDER — BUDESONIDE-FORMOTEROL FUMARATE 160-4.5 MCG/ACT IN AERO: 2.0000 | INHALATION_SPRAY | Freq: Two times a day (BID) | RESPIRATORY_TRACT | Status: AC

## 2015-09-26 MED ORDER — DOXYCYCLINE HYCLATE 100 MG PO CAPS: 100.0000 mg | ORAL_CAPSULE | Freq: Two times a day (BID) | ORAL | Status: DC

## 2015-09-26 MED ORDER — TIOTROPIUM BROMIDE MONOHYDRATE 18 MCG IN CAPS: 18.0000 ug | ORAL_CAPSULE | Freq: Every day | RESPIRATORY_TRACT | Status: DC

## 2015-09-26 MED ORDER — GUAIFENESIN-DM 100-10 MG/5ML PO SYRP: 5.0000 mL | ORAL_SOLUTION | ORAL | Status: AC | PRN

## 2015-09-26 NOTE — Progress Notes (Signed)
Pt discharged to home.  Discharge instructions explained to pt.  Pt has no questions at the time of discharge.  Pt states he has all belongings.  IV removed.  Pt ambulated off unit on his own.   

## 2015-09-26 NOTE — Discharge Summary (Signed)
Physician Discharge Summary  Eric Houston [id number] DOB: 18-Sep-1956 DOA: 09/24/2015  PCP: Harvie Junior, MD  Admit date: 09/24/2015 Discharge date: 09/26/2015  Time spent: 40 minutes  Recommendations for Outpatient Follow-up:  Reassess breathing and oxygen needs Arrange outpatient follow up with pulmonary service for PFT's, if needed repeat sleep study and adjustment on his maintenance therapy for chronic obstructive pulmonary disease. Follow CBG's and adjust hypoglycemic regimen as needed  Discharge Diagnoses:  COPD exacerbation Bronchiectasis  Hypoxia Hypertension Diabetes mellitus, type 2 ([place]) Sleep apnea Hydrocephalus Peripheral Edema Tobacco abuse  Discharge Condition: stable and improved. Discharge home with arrangements for home health oxygen (especially on exertion); will follow up with PCP in 10 days.  Diet recommendation: heart healthy and low carbohydrates diet   Filed Weights   09/24/15 0859 09/24/15 1622  Weight: 138.347 kg (305 lb) 138.347 kg (305 lb)    History of present illness:  59 y.o. male with history of former tobacco use, asthma, sleep apnea, hypertension and diabetes. Patient states he has had asthma since childhood but has never required hospitalization. He uses daily inhalers . Patient saw PCP on Friday for shortness breath and wheezing. He was given an injection and 5 days of antibiotics but patient does not know the name of either. Symptoms did not resolved and started to get worse again.Chest feels tight but no significant chest pain. No orthopnea. Patient has developed a cough productive of clear sputum. He complains of chills.  Hospital Course:  Dyspnea, cough, and wheezing with underlying asthma / chronic airway obstruction. Mild hypoxia on exertion  -will need outpatient follow up with pulmonary service -Repeat PFT's -adjustment on maintenance therapy -patient discharge on steroids tapering, doxycycline BID, PNR albuterol,  Spiriva and Symbicort -advise to quit smoking and to minimize prolonged cold weather exposure -also discharge with instructions to use flutter valve -PRN robitussin/guafensin also provided  -discharge on 2L [place] oxygen supplementation, especially on exertion   Hypertension. Controlled at present -Continue home antihypertensive regimen    Peripheral edema, On diuretics at home. No evidence for heart failure on CXR.  -patient with hx of venous insufficiency  -will continue TED hoses use -continue home diuretic regimen and low sodium diet  -encourage to close follow up on his weight  Diabetes mellitus, type 2.  -will continue resume oral diabetic agents while inpatient -expecting elevated values, due to steroids -outpatient follow up with PCP for A1C check and further adjustments on his hypoglycemic regimen     Sleep apnea.  -Not using CPAP on a regular basis -educated about importance of CPAP  -will need outpatient follow up with pulmonary service and if due repeat sleep study  Hydrocephalus. Shunt in place -no HA's, no blurred vision  Tobacco abuse -cessation counseling provided -patient was very receptive and is looking to quit   Procedures:  See below for x-ray reports   Consultations:  None   Discharge Exam: Filed Vitals:   09/26/15 0959 09/26/15 1438  BP: 137/76 150/88  Pulse: 61 58  Temp:  97.7 F (36.5 C)  Resp:  18    General: Afebrile, breathing much better and w/o CP. Patient drop his O2 sat with exertion to 86-87 on RA and stable with 2L of oxygen.  Cardiovascular: S1 and S2, no rubs or gallops  Respiratory: improve air movement, very mild end exp wheezing on exam; no use of accessory muscles. No Crackles   Abdomen:obese, soft, NT, ND, positive BS   Musculoskeletal: TED's in place, no cyanosis and  improvement on edema   Discharge Instructions   Discharge Instructions    Diet - low sodium heart healthy    Complete by:  As directed       Discharge instructions    Complete by:  As directed   Take medications as prescribed Please arrange follow up with PCP in 10 days Follow a low sodium and low carbohydrates diet Maintain adequate hydration Follow your weight on daily basis  Please avoid prolonged exposures to cold weather and use humidifier at home     Increase activity slowly    Complete by:  As directed           Current Discharge Medication List    START taking these medications   Details  budesonide-formoterol (SYMBICORT) 160-4.5 MCG/ACT inhaler Inhale 2 puffs into the lungs 2 (two) times daily. Qty: 1 Inhaler, Refills: 3    doxycycline (VIBRAMYCIN) 100 MG capsule Take 1 capsule (100 mg total) by mouth 2 (two) times daily. Qty: 16 capsule, Refills: 0    guaiFENesin-dextromethorphan (ROBITUSSIN DM) 100-10 MG/5ML syrup Take 5 mLs by mouth every 4 (four) hours as needed for cough. Qty: 118 mL, Refills: 0    predniSONE (DELTASONE) 20 MG tablet Take 3 tablets by mouth X 1 day; then 2 tablets by mouth X 2 days; then 1 tablet by mouth X 3 days; then 1/2 tablet by mouth daily X 3 days and stop prednisone Qty: 15 tablet, Refills: 0    tiotropium (SPIRIVA HANDIHALER) 18 MCG inhalation capsule Place 1 capsule (18 mcg total) into inhaler and inhale daily. Qty: 30 capsule, Refills: 3      CONTINUE these medications which have NOT CHANGED   Details  albuterol (VENTOLIN HFA) 108 (90 BASE) MCG/ACT inhaler Inhale 2 puffs into the lungs every 4 (four) hours as needed.     furosemide (LASIX) 80 MG tablet Take 80 mg by mouth daily.     losartan-hydrochlorothiazide (HYZAAR) 50-12.5 MG tablet Take 1 tablet by mouth daily.    omeprazole (PRILOSEC) 20 MG capsule Take 20 mg by mouth daily.    pioglitazone (ACTOS) 45 MG tablet Take 45 mg by mouth daily.    Potassium 99 MG TABS Take 99 mg by mouth daily.       Allergies  Allergen Reactions  . Sulfa Antibiotics     rash   Follow-up Information    Follow up with  Harvie Junior, MD. Schedule an appointment as soon as possible for a visit in 10 days.   Specialty:  Family Medicine   Contact information:   [address] [place] [address] [phone]       The results of significant diagnostics from this hospitalization (including imaging, microbiology, ancillary and laboratory) are listed below for reference.    Significant Diagnostic Studies: Dg Chest 2 View  09/24/2015  CLINICAL DATA:  Two week history of shortness of breath with cough and congestion EXAM: CHEST  2 VIEW COMPARISON:  June 21, 2012 FINDINGS: There is mild scarring in the left lower lobe. There is no edema or consolidation. Heart is upper normal in size with pulmonary vascularity within normal limits. No adenopathy. Shunt catheter extends along the medial anterior right hemi- thorax. There is degenerative change in the thoracic spine. IMPRESSION: No edema or consolidation. Heart upper normal in size. Slight scarring left lower lobe. Electronically Signed   By: Lowella Grip III M.D.   On: 09/24/2015 10:12   Labs: Basic Metabolic Panel:  Recent Labs Lab  09/24/15 0939 09/25/15 0553  NA 141 139  K 3.6 4.0  CL 104 101  CO2 29 26  GLUCOSE 107* 114*  BUN 14 16  CREATININE 1.00 1.00  CALCIUM 8.7* 9.1   Liver Function Tests:  Recent Labs Lab 09/24/15 0939  AST 19  ALT 21  ALKPHOS 60  BILITOT 0.6  PROT 5.9*  ALBUMIN 2.9*   CBC:  Recent Labs Lab 09/24/15 0939 09/25/15 0553  WBC 6.7 12.0*  NEUTROABS 4.2  --   HGB 12.0* 12.6*  HCT 36.0* 37.7*  MCV 90.9 89.8  PLT 217 267   CBG:  Recent Labs Lab 09/25/15 1224 09/25/15 1729 09/25/15 2144 09/26/15 0751 09/26/15 1210  GLUCAP 118* 174* 96 87 150*    Signed:  Barton Dubois MD   [facility] 09/26/2015, 4:40 PM

## 2015-09-26 NOTE — Progress Notes (Signed)
Ambulated patient with pulse ox. O2 sats dropped down to 86% on room air Once back in room in chair O2 sats went up to 93% on room air

## 2015-09-26 NOTE — Progress Notes (Signed)
Occupational Therapy Treatment Patient Details Name: Eric Houston MRN: [id number] DOB: [birth year] Today's Date: 09/26/2015    History of present illness Pt with a Past Medical History of HTN, DM, Asthma, OCA, hydrocephalus who presents with acute respiratory distress   OT comments  Follow up OT session with focus on energy conservation strategies and to practice tub shower transfer. All goals met and education completed. No further OT needs indicated at this time. OT signing off.   Follow Up Recommendations  No OT follow up    Equipment Recommendations  None recommended by OT    Recommendations for Other Services      Precautions / Restrictions Precautions Precautions: Other (comment) Restrictions Weight Bearing Restrictions: No       Mobility Bed Mobility               General bed mobility comments: in recliner  Transfers Overall transfer level: Modified independent Equipment used: None                  Balance Overall balance assessment: No apparent balance deficits (not formally assessed)                                 ADL                                   Tub/ Shower Transfer: Modified independent;Tub transfer;Ambulation   Functional mobility during ADLs: Modified independent General ADL Comments: Educated on energy conservation strategies and provided hoandout. Pt completed simulated tub shower transfer mod I level.      Vision                     Perception     Praxis      Cognition   Behavior During Therapy: WFL for tasks assessed/performed Overall Cognitive Status: Within Functional Limits for tasks assessed                       Extremity/Trunk Assessment               Exercises     Shoulder Instructions       General Comments      Pertinent Vitals/ Pain       Pain Assessment: Faces Faces Pain Scale: No hurt  Home Living                                           Prior Functioning/Environment              Frequency Min 1X/week     Progress Toward Goals  OT Goals(current goals can now be found in the care plan section)  Progress towards OT goals: Goals met/education completed, patient discharged from OT  Acute Rehab OT Goals Patient Stated Goal: not stated OT Goal Formulation: With patient Time For Goal Achievement: 10/02/15 Potential to Achieve Goals: Good ADL Goals Pt Will Perform Tub/Shower Transfer: Tub transfer;with modified independence;shower seat Additional ADL Goal #1: Pt will independently verbalize 2 energy conservation strategies for use during ADLs.  Plan All goals met and education completed, patient discharged from OT services    Co-evaluation  End of Session     Activity Tolerance Patient tolerated treatment well   Patient Left in chair;with call bell/phone within reach   Nurse Communication          Time: 9009-2004 OT Time Calculation (min): 8 min  Charges: OT General Charges $OT Visit: 1 Procedure OT Treatments $Self Care/Home Management : 8-22 mins  Hortencia Pilar 09/26/2015, 11:46 AM

## 2015-09-27 NOTE — Progress Notes (Addendum)
Ambulated pt in hall with pulse ox and sats decreased to 86%.  Placed on 2L of oxygen and O2 sats increased to 95%.

## 2015-11-05 ENCOUNTER — Encounter: Payer: Self-pay | Admitting: Internal Medicine

## 2015-11-05 ENCOUNTER — Ambulatory Visit (INDEPENDENT_AMBULATORY_CARE_PROVIDER_SITE_OTHER): Payer: Medicare HMO | Admitting: Internal Medicine

## 2015-11-05 VITALS — BP 152/98 | HR 83 | Ht 66.0 in | Wt 317.4 lb

## 2015-11-05 DIAGNOSIS — J4530 Mild persistent asthma, uncomplicated: Secondary | ICD-10-CM

## 2015-11-05 DIAGNOSIS — R0602 Shortness of breath: Secondary | ICD-10-CM | POA: Diagnosis not present

## 2015-11-05 DIAGNOSIS — R0600 Dyspnea, unspecified: Secondary | ICD-10-CM | POA: Diagnosis not present

## 2015-11-05 DIAGNOSIS — I10 Essential (primary) hypertension: Secondary | ICD-10-CM | POA: Diagnosis not present

## 2015-11-05 NOTE — Patient Instructions (Addendum)
Stop spiriva for now to see what difference if any it makes on your walk down the block and back and if loosing ground ok to restart   symbicort 160 Take 2 puffs first thing in am and then another 2 puffs about 12 hours later.   Work on inhaler technique:  relax and gently blow all the way out then take a nice smooth deep breath back in, triggering the inhaler at same time you start breathing in.  Hold for up to 5 seconds if you can. Blow out thru nose. Rinse and gargle with water when done  Continue prilosec Take 30-60 min before first meal of the day   GERD (REFLUX)  is an extremely common cause of respiratory symptoms just like yours , many times with no obvious heartburn at all.    It can be treated with medication, but also with lifestyle changes including elevation of the head of your bed (ideally with 6 inch  bed blocks),  Smoking cessation, avoidance of late meals, excessive alcohol, and avoid fatty foods, chocolate, peppermint, colas, red wine, and acidic juices such as orange juice.  NO MINT OR MENTHOL PRODUCTS SO NO COUGH DROPS  USE SUGARLESS CANDY INSTEAD (Jolley ranchers or Stover's or Life Savers) or even ice chips will also do - the key is to swallow to prevent all throat clearing. NO OIL BASED VITAMINS - use powdered substitutes.      Please schedule a follow up office visit in 6 weeks, call sooner if needed with pfts on return

## 2015-11-05 NOTE — Progress Notes (Signed)
Subjective:    Patient ID: Eric Houston, male    DOB: 04/30/1956,    MRN: [id number]  HPI  24 yowm  Quit smoking 1986 frequently needed saba with exertion and bad "bronchitis" more late fall/winter  activity and worse while smoking prev eval by Dr Eric Houston with dx of asthmatic bronchitis but having daily symptoms x 2015 so referred to pulmonary clinic  11/05/2015 by Dr Eric Houston p admit   Admit date: 09/24/2015 Discharge date: 09/26/2015      Recommendations for Outpatient Follow-up:  Reassess breathing and oxygen needs Arrange outpatient follow up with pulmonary service for PFT's, if needed repeat sleep study and adjustment on his maintenance therapy for chronic obstructive pulmonary disease. Follow CBG's and adjust hypoglycemic regimen as needed  Discharge Diagnoses:  COPD exacerbation Bronchiectasis  Hypoxia Hypertension Diabetes mellitus, type 2 ([place]) Sleep apnea Hydrocephalus Peripheral Edema Tobacco abuse  Discharge Condition: stable and improved. Discharge home with arrangements for home health oxygen (especially on exertion); will follow up with PCP in 10 days.  Diet recommendation: heart healthy and low carbohydrates diet   Filed Weights   09/24/15 0859 09/24/15 1622  Weight: 138.347 kg (305 lb) 138.347 kg (305 lb)    History of present illness:  60 y.o. male with history of former tobacco use, asthma, sleep apnea, hypertension and diabetes. Patient states he has had asthma since childhood but has never required hospitalization. He uses daily inhalers . Patient saw PCP on Friday for shortness breath and wheezing. He was given an injection and 5 days of antibiotics but patient does not know the name of either. Symptoms did not resolved and started to get worse again.Chest feels tight but no significant chest pain. No orthopnea. Patient has developed a cough productive of clear sputum. He complains of chills.  Hospital Course:  Dyspnea, cough,  and wheezing with underlying asthma / chronic airway obstruction. Mild hypoxia on exertion  -will need outpatient follow up with pulmonary service -Repeat PFT's -adjustment on maintenance therapy -patient discharge on steroids tapering, doxycycline BID, PNR albuterol, Spiriva and Symbicort -advise to quit smoking and to minimize prolonged cold weather exposure -also discharge with instructions to use flutter valve -PRN robitussin/guafensin also provided  -discharge on 2L [place] oxygen supplementation, especially on exertion   Hypertension. Controlled at present -Continue home antihypertensive regimen   Peripheral edema, On diuretics at home. No evidence for heart failure on CXR.  -patient with hx of venous insufficiency  -will continue TED hoses use -continue home diuretic regimen and low sodium diet  -encourage to close follow up on his weight  Diabetes mellitus, type 2.  -will continue resume oral diabetic agents while inpatient -expecting elevated values, due to steroids -outpatient follow up with PCP for A1C check and further adjustments on his hypoglycemic regimen    Sleep apnea.  -Not using CPAP on a regular basis -educated about importance of CPAP  -will need outpatient follow up with pulmonary service and if due repeat sleep study  Hydrocephalus. Shunt in place -no HA's, no blurred vision  Tobacco abuse -cessation counseling provided -patient was very receptive and is looking to quit         11/05/2015 1st [facility] office visit/ Eric Houston   Chief Complaint  Patient presents with  . Pulmonary Consult    Referred by Dr. York Houston. Pt states dxed with COPD in Dec 2016. He c/o SOB and non prod cough. He gets SOB walking from room to room at home. He is  using albuterol 2-3 x per wk on average.   was room to room but now improved to point where can go down the block and back s stopping but sob if attempts walk @  nl pace = MMRC2 = can't walk a nl pace  on a flat grade s sob/ cough is worse in am and in colder weather   No obvious other patterns in day to day or daytime variabilty or assoc  cp or chest tightness, subjective wheeze overt sinus or hb symptoms. No unusual exp hx or h/o childhood pna/ asthma or knowledge of premature birth.  Sleeping ok without nocturnal  or early am exacerbation  of respiratory  c/o's or need for noct saba. Also denies any obvious fluctuation of symptoms with weather or environmental changes or other aggravating or alleviating factors except as outlined above   Current Medications, Allergies, Complete Past Medical History, Past Surgical History, Family History, and Social History were reviewed in [employer] record.             Review of Systems  Constitutional: Negative for fever, chills, activity change, appetite change and unexpected weight change.  HENT: Positive for congestion, postnasal drip and sore throat. Negative for dental problem, rhinorrhea, sneezing, trouble swallowing and voice change.   Eyes: Negative for visual disturbance.  Respiratory: Positive for cough and shortness of breath. Negative for choking.   Cardiovascular: Positive for leg swelling. Negative for chest pain.  Gastrointestinal: Negative for nausea, vomiting and abdominal pain.  Genitourinary: Negative for difficulty urinating.  Musculoskeletal: Negative for arthralgias.  Skin: Negative for rash.  Psychiatric/Behavioral: Negative for behavioral problems and confusion.       Objective:   Physical Exam  amb hoarse obese wm nad  Wt Readings from Last 3 Encounters:  11/05/15 317 lb 6.4 oz (143.972 kg)  09/24/15 305 lb (138.347 kg)  10/26/14 322 lb (146.058 kg)    Vital signs reviewed   HEENT: nl dentition, turbinates, and oropharynx. Nl external ear canals without cough reflex   NECK :  without JVD/Nodes/TM/ nl carotid upstrokes bilaterally   LUNGS: no acc muscle use,  Nl contour chest which is  clear to A and P bilaterally without cough on insp or exp maneuvers   CV:  RRR  no s3 or murmur or increase in P2,  2+ pitting bilateral lower ext with venous stasis changes   ABD:  soft and nontender with nl inspiratory excursion in the supine position. No bruits or organomegaly, bowel sounds nl  MS:  Nl gait/ ext warm without deformities, calf tenderness, cyanosis or clubbing No obvious joint restrictions   SKIN: warm and dry without lesions    NEURO:  alert, approp, nl sensorium with  no motor deficits     I personally reviewed images and agree with radiology impression as follows:  CXR:   09/24/15  No edema or consolidation. Heart upper normal in size. Slight scarring left lower lobe.          Assessment & Plan:

## 2015-11-06 ENCOUNTER — Encounter: Payer: Self-pay | Admitting: Internal Medicine

## 2015-11-06 NOTE — Assessment & Plan Note (Signed)
Not well controlled and clearly vol overloaded, strongly advise d/c actos as already on reasonable doses of lasix > Please see patient coordinator before you leave today  to schedule

## 2015-11-06 NOTE — Assessment & Plan Note (Signed)
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 PFTs 06/25/11    FEV1 2.13 (70 % ) ratio 79  p 13 % improvement from saba with DLCO  75 % corrects to 141 % for alv volume   - Spirometry 11/05/2015  FEV1 1.38 (42%)  Ratio 72  - 11/05/2015  extensive coaching HFA effectiveness =    75% > try off spiriva and on symbicort 160 2bid   I had an extended discussion with the patient reviewing all relevant studies completed to date and  lasting 15 to 20 minutes of a 25 minute visit on the following ongoing concerns:   1) there is not copd so there is no role for spiriva   2) there may be enough AB however to respond to symbicort 160 2bid so worth a try   3) main issue to doe is likely restrictive/ wt related  (see separate a/p)   4) Each maintenance medication was reviewed in detail including most importantly the difference between maintenance and as needed and under what circumstances the prns are to be used.  Please see instructions for details which were reviewed in writing and the patient given a copy.    Total time devoted to counseling  = 235/3m ov/ transition of care from Dr Joya Gaskins > 3 y out so new pt eval/  review case with pt/ discussion of options/alternatives/ personally creating in presence of pt  then going over specific  Instructions directly with the pt including how to use all of the meds but in particular covering each new medication in detail (see avs)

## 2015-11-06 NOTE — Assessment & Plan Note (Addendum)
11/05/2015  Walked RA x 3 laps @ 185 ft each stopped due to  End of study, nl pace, no sob or desat     Encouraged to continue walking regularly to get into neg cal bal

## 2015-11-06 NOTE — Assessment & Plan Note (Signed)
Body mass index is 51.25   Lab Results  Component Value Date   TSH 1.721 10/20/2007     Contributing to gerd tendency/ doe/reviewed the need and the process to achieve and maintain neg calorie balance > defer f/u primary care including intermittently monitoring thyroid status

## 2015-12-27 ENCOUNTER — Ambulatory Visit: Payer: Medicare HMO | Admitting: Internal Medicine

## 2016-01-20 ENCOUNTER — Ambulatory Visit: Payer: Medicare HMO | Admitting: Internal Medicine

## 2016-01-20 ENCOUNTER — Ambulatory Visit (HOSPITAL_COMMUNITY): Admission: RE | Admit: 2016-01-20 | Payer: Medicare HMO | Source: Ambulatory Visit

## 2016-02-18 ENCOUNTER — Ambulatory Visit (INDEPENDENT_AMBULATORY_CARE_PROVIDER_SITE_OTHER): Payer: Medicare HMO | Admitting: Internal Medicine

## 2016-02-18 DIAGNOSIS — R0600 Dyspnea, unspecified: Secondary | ICD-10-CM

## 2016-02-18 LAB — PULMONARY FUNCTION TEST
DL/VA % pred: 107 %
DL/VA: 4.65 ml/min/mmHg/L
DLCO COR % PRED: 80 %
DLCO cor: 21.69 ml/min/mmHg
DLCO unc % pred: 79 %
DLCO unc: 21.44 ml/min/mmHg
FEF 25-75 POST: 0.86 L/s
FEF 25-75 Pre: 1.1 L/sec
FEF2575-%Change-Post: -21 %
FEF2575-%Pred-Post: 33 %
FEF2575-%Pred-Pre: 42 %
FEV1-%CHANGE-POST: -2 %
FEV1-%Pred-Post: 52 %
FEV1-%Pred-Pre: 53 %
FEV1-POST: 1.62 L
FEV1-PRE: 1.66 L
FEV1FVC-%CHANGE-POST: 12 %
FEV1FVC-%Pred-Pre: 94 %
FEV6-%Change-Post: -13 %
FEV6-%PRED-PRE: 59 %
FEV6-%Pred-Post: 51 %
FEV6-PRE: 2.31 L
FEV6-Post: 2.01 L
FEV6FVC-%PRED-PRE: 105 %
FEV6FVC-%Pred-Post: 105 %
FVC-%CHANGE-POST: -13 %
FVC-%PRED-POST: 48 %
FVC-%PRED-PRE: 56 %
FVC-POST: 2.01 L
FVC-PRE: 2.31 L
POST FEV6/FVC RATIO: 100 %
PRE FEV6/FVC RATIO: 100 %
Post FEV1/FVC ratio: 81 %
Pre FEV1/FVC ratio: 72 %
RV % PRED: 119 %
RV: 2.43 L
TLC % PRED: 81 %
TLC: 5.01 L

## 2016-02-18 NOTE — Progress Notes (Signed)
PFT done today. 

## 2016-02-25 NOTE — Progress Notes (Signed)
Quick Note:  Spoke with pt and notified of results per Dr. Wert. Pt verbalized understanding and denied any questions.  ______ 

## 2016-03-02 ENCOUNTER — Ambulatory Visit: Payer: Medicare HMO | Admitting: Internal Medicine

## 2016-05-25 ENCOUNTER — Encounter: Payer: Self-pay | Admitting: Physician Assistant

## 2016-05-25 ENCOUNTER — Ambulatory Visit (INDEPENDENT_AMBULATORY_CARE_PROVIDER_SITE_OTHER): Payer: Medicare HMO | Admitting: Physician Assistant

## 2016-05-25 ENCOUNTER — Ambulatory Visit (INDEPENDENT_AMBULATORY_CARE_PROVIDER_SITE_OTHER): Payer: Medicare HMO

## 2016-05-25 VITALS — BP 126/80 | HR 82 | Temp 98.2°F | Resp 17 | Ht 65.5 in | Wt 311.0 lb

## 2016-05-25 DIAGNOSIS — E669 Obesity, unspecified: Secondary | ICD-10-CM | POA: Diagnosis not present

## 2016-05-25 DIAGNOSIS — M25561 Pain in right knee: Secondary | ICD-10-CM

## 2016-05-25 DIAGNOSIS — I10 Essential (primary) hypertension: Secondary | ICD-10-CM

## 2016-05-25 DIAGNOSIS — R609 Edema, unspecified: Secondary | ICD-10-CM

## 2016-05-25 LAB — COMPLETE METABOLIC PANEL WITH GFR
ALBUMIN: 3.9 g/dL (ref 3.6–5.1)
ALT: 11 U/L (ref 9–46)
AST: 17 U/L (ref 10–35)
Alkaline Phosphatase: 77 U/L (ref 40–115)
BILIRUBIN TOTAL: 0.5 mg/dL (ref 0.2–1.2)
BUN: 28 mg/dL — ABNORMAL HIGH (ref 7–25)
CALCIUM: 9 mg/dL (ref 8.6–10.3)
CO2: 32 mmol/L — ABNORMAL HIGH (ref 20–31)
CREATININE: 1.23 mg/dL (ref 0.70–1.25)
Chloride: 100 mmol/L (ref 98–110)
GFR, EST AFRICAN AMERICAN: 73 mL/min (ref >=60)
GFR, Est Non African American: 63 mL/min (ref >=60)
Glucose, Bld: 93 mg/dL (ref 65–99)
Potassium: 3.7 mmol/L (ref 3.5–5.3)
Sodium: 140 mmol/L (ref 135–146)
TOTAL PROTEIN: 7 g/dL (ref 6.1–8.1)

## 2016-05-25 LAB — TSH: TSH: 1.71 m[IU]/L (ref 0.40–4.50)

## 2016-05-25 NOTE — Progress Notes (Signed)
Patient ID: Eric Houston, male   DOB: 1955/12/25, 60 y.o.   MRN: [id number] [facility] [address], [place] [address] [phone]  Date:  05/25/2016   Name:  Eric Houston   DOB:  Jul 26, 1956   MRN:  [id number]  PCP:  Harvie Junior, MD   By signing my name below, I, Ladene Artist, attest that this documentation has been prepared under the direction and in the presence of Ivar Drape, PA-C Electronically Signed: Ladene Artist, ED Scribe 05/25/2016 at 10:00 AM.  History of Present Illness:  Eric Houston is a 60 y.o. male patient who presents to [facility] complaining of acute on chronic right knee pain. Pt states that he visited his PCP's office and discovered that his practice had closed. Pt states that he was taken hydrocodone long-term but it stopped working so he was started on Percocet 10 which he ran out of 1 week ago. He has been seen by [facility] in the past and had 2 cortisone injections in the past with significant relief, however, he states that he cannot afford to follow-up with them at this time. Pt does not have a cardiologist. He denies chest pain and palpations. Pt reports chronic sob. He states that he has improved his diet within the past week and stopped drinking soda within the past week.   Wt Readings from Last 3 Encounters:  05/25/16 (!) 311 lb (141.1 kg)  11/05/15 (!) 317 lb 6.4 oz (144 kg)  09/24/15 (!) 305 lb (138.3 kg)    Patient Active Problem List   Diagnosis Date Noted   Severe obesity (BMI >= 40) ([place]) 11/06/2015   Type 2 diabetes mellitus with hyperglycemia, without long-term current use of insulin ([place])    Dyspnea 09/24/2015   Asthma 09/24/2015   Well controlled diabetes mellitus ([place])    Essential hypertension    Diabetes mellitus, type 2 ([place])    Asthmatic bronchitis    Sleep apnea    Hydrocephalus    PLANTAR FASCIITIS, LEFT 11/28/2008   HEEL PAIN, LEFT 11/28/2008   PES PLANUS 11/28/2008    SHOULDER PAIN, LEFT 10/03/2008   KNEE PAIN, BILATERAL 10/03/2008    Past Medical History:  Diagnosis Date   Arthritis    Asthmatic bronchitis    COPD (chronic obstructive pulmonary disease) ([facility])    Diabetes mellitus type II    Headache    Hydrocephalus    vp shunt   Hypertension    Shortness of breath dyspnea    Sleep apnea     Past Surgical History:  Procedure Laterality Date   neck fusion     TONSILLECTOMY     VENTRICULOPERITONEAL SHUNT     x 2    Social History  Substance Use Topics   Smoking status: Former Smoker    Packs/day: 2.00    Years: 2.00    Types: Cigarettes    Quit date: 11/14/1984   Smokeless tobacco: Never Used   Alcohol use Yes     Comment: occasionally on weekends    Family History  Problem Relation Age of Onset   Emphysema Father     smoked   Cancer Mother     pancreatic   Diabetes Mother    Diabetes Father    Diabetes Brother     Allergies  Allergen Reactions   Sulfa Antibiotics     rash    Medication list has been reviewed and updated.  Current Outpatient Prescriptions  on File Prior to Visit  Medication Sig Dispense Refill   albuterol (VENTOLIN HFA) 108 (90 BASE) MCG/ACT inhaler Inhale 2 puffs into the lungs every 4 (four) hours as needed.      budesonide-formoterol (SYMBICORT) 160-4.5 MCG/ACT inhaler Inhale 2 puffs into the lungs 2 (two) times daily. 1 Inhaler 3   furosemide (LASIX) 80 MG tablet Take 80 mg by mouth daily.      guaiFENesin-dextromethorphan (ROBITUSSIN DM) 100-10 MG/5ML syrup Take 5 mLs by mouth every 4 (four) hours as needed for cough. 118 mL 0   losartan-hydrochlorothiazide (HYZAAR) 50-12.5 MG tablet Take 1 tablet by mouth daily.     omeprazole (PRILOSEC) 20 MG capsule Take 20 mg by mouth daily.     pioglitazone (ACTOS) 45 MG tablet Take 45 mg by mouth daily.     Potassium 99 MG TABS Take 99 mg by mouth daily.     tiotropium (SPIRIVA HANDIHALER) 18 MCG inhalation capsule Place  1 capsule (18 mcg total) into inhaler and inhale daily. (Patient not taking: Reported on 05/25/2016) 30 capsule 3   No current facility-administered medications on file prior to visit.     Review of Systems  Respiratory: Positive for shortness of breath (chronic).   Cardiovascular: Negative for chest pain and palpitations.  Musculoskeletal: Positive for joint pain.    Physical Examination: BP 126/80 (BP Location: Right Arm, Patient Position: Sitting, Cuff Size: Large)    Pulse 82    Temp 98.2 F (36.8 C) (Oral)    Resp 17    Ht 5' 5.5" (1.664 m)    Wt (!) 311 lb (141.1 kg)    SpO2 96%    BMI 50.97 kg/m  Ideal Body Weight: @FLOWAMB [id number]  Physical Exam  Constitutional: He is oriented to person, place, and time. He appears well-developed and well-nourished. No distress.  HENT:  Head: Normocephalic and atraumatic.  Eyes: Conjunctivae and EOM are normal. Pupils are equal, round, and reactive to light.  Cardiovascular: Normal rate and regular rhythm.  Exam reveals no gallop and no friction rub.   Murmur heard.  Systolic murmur is present with a grade of 1/6  Pulmonary/Chest: Effort normal. No respiratory distress. He has no decreased breath sounds. He has no wheezes. He has no rhonchi. He has no rales.  Musculoskeletal: He exhibits edema and tenderness.  Diffuse R tenderness. No medial or lateral joint laxity. Negative anterior drawer test. No patellar tenderness. 1+ pitting edema of the LE.   Neurological: He is alert and oriented to person, place, and time.  Skin: Skin is warm and dry. He is not diaphoretic.  Psychiatric: He has a normal mood and affect. His behavior is normal.    Assessment and Plan: Eric Houston is a 60 y.o. male who is here today for right knee pain, and to establish care. --he Has requested Percocet (10 mg). We will not do this today. I've advised him to use Tylenol 500-750 every 6 hours. Advised him to take it routinely with food and warned against not  chasing pain. I'm placing a referral to or through. It is likely that he is a good candidate for a knee replacement. He also needs a referral to cardiology. This dosing change of the furosemide and daily 80 mg dose is not helping the peripheral edema at this time. I've encouraged him to continue his diet and weight loss regimen. This will also help with his knee pain gradually, though not helpful. If he is not a good candidate for a  knee replacement and the pain is chronic, we will need to consider referral to pain management clinic. We will await cardiology referral prior to refilling the current meds he should see provider within the next 3 weeks. Essential hypertension - Plan: COMPLETE METABOLIC PANEL WITH GFR, TSH, EKG 12-Lead, Ambulatory referral to Cardiology  Right knee pain - Plan: DG Knee Complete 4 Views Right, AMB referral to orthopedics  Obesity - Plan: Ambulatory referral to Cardiology  Peripheral edema - Plan: Ambulatory referral to Cardiology  Ivar Drape, PA-C [facility] 05/25/2016 9:31 AM

## 2016-05-25 NOTE — Patient Instructions (Addendum)
Please ice the knee three times per day.  You can take tylenol for pain--with food.  Take it on schedule four times per day (every six hours) at 500mg -750mg .  If this is not working well, contact me. I am placing a referral to ortho.  They may suggest another injection or replacement.      IF you received an x-ray today, you will receive an invoice from [facility]. Please contact [facility] at [phone] with questions or concerns regarding your invoice.   IF you received labwork today, you will receive an invoice from [employer]. Please contact [facility] at [phone] with questions or concerns regarding your invoice.   Our billing staff will not be able to assist you with questions regarding bills from these companies.  You will be contacted with the lab results as soon as they are available. The fastest way to get your results is to activate your My Chart account. Instructions are located on the last page of this paperwork. If you have not heard from [place] regarding the results in 2 weeks, please contact this office.

## 2016-06-04 ENCOUNTER — Telehealth: Payer: Self-pay

## 2016-06-04 DIAGNOSIS — M25561 Pain in right knee: Secondary | ICD-10-CM

## 2016-06-04 DIAGNOSIS — M25562 Pain in left knee: Principal | ICD-10-CM

## 2016-06-04 NOTE — Telephone Encounter (Signed)
Patient request a referral to [facility]. [phone].

## 2016-06-05 NOTE — Telephone Encounter (Signed)
Ok for knee pain [place]?

## 2016-06-08 NOTE — Telephone Encounter (Signed)
Absolutely, referral sent.

## 2016-06-10 NOTE — Progress Notes (Signed)
Patient advised.

## 2016-07-09 NOTE — Progress Notes (Signed)
New Outpatient Visit Date: 07/10/2016  Referring Provider: Ladene Artist, MD [facility] [place], [place] [address]  Chief Complaint: Leg swelling  HPI:  Eric Houston is a 60 y.o. year-old male with history of hypertension, type 2 diabetes mellitus, COPD, sleep apnea, and hydrocephalus status post VP shunt, who has been referred by  Dr. Lavone Neri for evaluation of chronic leg swelling. The patient reports he has had intermittent bilateral lower extremity edema for 2-3 years. This is worse when he is seated or standing for extended periods. The edema is usually well-controlled when he takes his furosemide as directed., However if he misses a few doses, he notices marked swelling. He also endorses some tightness in his calves and thighs when walking for extended periods. He reports that he recently underwent bilateral arterial ultrasound studies at an outside facility, and is waiting for the results. He has struck to sleep apnea and is compliant with CPAP. He denies orthopnea and PND.  Eric Houston also endorses exertional dyspnea with accompanying chest tightness when walking 2-3 blocks. If he rests for 10-15 minutes, the dyspnea and chest tightness resolve. The symptoms have been more pronounced over the last few months. He believes the symptoms are related to being overweight. However, he happily reports that he has lost about 50 pounds over the last few months. He endorses having intermittent chest tightness while "overexerting" himself at work 6-12 months ago, but did not have any further evaluation at that time.  The patient also has chronic dizziness that he has had since childhood. He attributes this to his hydrocephalus. It has been stable. He reports occasional brief fluttering in his chest, which lasts a few seconds at a time and is not associated with other  symptoms.  --------------------------------------------------------------------------------------------------  Cardiovascular History & Procedures: Cardiovascular Problems:  Chronic lower extremity edema  Exertional dyspnea and chest tightness  Risk Factors:  Hypertension, type 2 diabetes mellitus, male gender, age, and obesity  Cath/PCI:  None.  CV Surgery:  None.  EP Procedures and Devices:  None.  Non-Invasive Evaluation(s):  ABI (04/12/13): 1.2 bilaterally (normal).  Recent CV Pertinent Labs: Lab Results  Component Value Date   CHOL 211 (H) 07/10/2016   HDL 50 07/10/2016   LDLCALC 118 07/10/2016   TRIG 215 (H) 07/10/2016   CHOLHDL 4.2 07/10/2016   K 3.0 (L) 07/10/2016   BUN 28 (H) 07/10/2016   CREATININE 1.15 07/10/2016    --------------------------------------------------------------------------------------------------  Past Medical History:  Diagnosis Date  . Arthritis   . Asthmatic bronchitis   . COPD (chronic obstructive pulmonary disease) ([place])   . Diabetes mellitus type II   . Headache   . Hydrocephalus    vp shunt  . Hypertension   . Shortness of breath dyspnea   . Sleep apnea     Past Surgical History:  Procedure Laterality Date  . neck fusion    . TONSILLECTOMY    . VENTRICULOPERITONEAL SHUNT     x 2    Outpatient Encounter Prescriptions as of 07/10/2016  Medication Sig  . albuterol (VENTOLIN HFA) 108 (90 BASE) MCG/ACT inhaler Inhale 2 puffs into the lungs every 4 (four) hours as needed.   . budesonide-formoterol (SYMBICORT) 160-4.5 MCG/ACT inhaler Inhale 2 puffs into the lungs 2 (two) times daily.  . furosemide (LASIX) 80 MG tablet Take 80 mg by mouth daily.   Marland Kitchen guaiFENesin-dextromethorphan (ROBITUSSIN DM) 100-10 MG/5ML syrup Take 5 mLs by mouth every 4 (four) hours as needed for cough.  Marland Kitchen  losartan-hydrochlorothiazide (HYZAAR) 50-12.5 MG tablet Take 1 tablet by mouth daily.  . metFORMIN (GLUCOPHAGE) 500 MG tablet Take 500 mg by  mouth 2 (two) times daily.  Marland Kitchen omeprazole (PRILOSEC) 20 MG capsule Take 20 mg by mouth daily.  Marland Kitchen oxyCODONE-acetaminophen (PERCOCET) 10-325 MG tablet Take 10-325 tablets by mouth every 8 (eight) hours as needed for pain.  Marland Kitchen Potassium 99 MG TABS Take 99 mg by mouth daily.  Marland Kitchen aspirin EC 81 MG tablet Take 1 tablet (81 mg total) by mouth daily.  Marland Kitchen atorvastatin (LIPITOR) 20 MG tablet Take 1 tablet (20 mg total) by mouth daily.  . [DISCONTINUED] pioglitazone (ACTOS) 45 MG tablet Take 45 mg by mouth daily.  . [DISCONTINUED] tiotropium (SPIRIVA HANDIHALER) 18 MCG inhalation capsule Place 1 capsule (18 mcg total) into inhaler and inhale daily. (Patient not taking: Reported on 05/25/2016)   No facility-administered encounter medications on file as of 07/10/2016.     Allergies: Sulfa antibiotics  Social History   Social History  . Marital status: Legally Separated    Spouse name: N/A  . Number of children: 2  . Years of education: N/A   Occupational History  . Disability    Social History Main Topics  . Smoking status: Former Smoker    Packs/day: 2.00    Years: 2.00    Types: Cigarettes    Quit date: 11/14/1984  . Smokeless tobacco: Never Used  . Alcohol use 2.4 oz/week    4 Shots of liquor per week     Comment: mixed drinks on the weekend  . Drug use: No  . Sexual activity: Not on file   Other Topics Concern  . Not on file   Social History Narrative  . No narrative on file    Family History  Problem Relation Age of Onset  . Emphysema Father     smoked  . Diabetes Father   . Heart disease Father   . Heart attack Father   . Cancer Mother     pancreatic  . Diabetes Mother   . Heart disease Mother     Born with hole in the heart  . Stroke Mother   . Diabetes Brother     Review of Systems: A 12-system review of systems was performed and was negative except as noted in the  HPI.  --------------------------------------------------------------------------------------------------  Physical Exam: BP 128/84   Pulse 78   Ht 5' 5.5" (1.664 m)   Wt (!) 303 lb 9.6 oz (137.7 kg)   BMI 49.75 kg/m   General:  Morbidly obese man seated comfortably in the exam room. HEENT: No conjunctival pallor or scleral icterus.  Moist mucous membranes.  OP clear. Neck: Supple without lymphadenopathy, thyromegaly, JVD, or HJR.  No carotid bruit. Lungs: Normal work of breathing.  Clear to auscultation bilaterally without wheezes or crackles. Heart: Regular rate and rhythm with 2/6 systolic crescendo-decrescendo murmur loudest at the right upper sternal border radiation across the precordium. No rubs or gallops.  Non-displaced PMI. Abd: Bowel sounds present.  Soft, NT/ND; unable to assess hepatosplenomegaly due to body habitus. Ext: 2+ pretibial edema bilaterally.  Radial pulses are 2+ bilaterally. Posterior tibial and dorsalis pedis pulses are 1+ bilaterally, though Skin: warm and dry without rash Neuro: CNIII-XII intact.  Strength and fine-touch sensation intact in upper and lower extremities bilaterally. Psych: Normal mood and affect.  EKG:  Normal sinus rhythm with rightward axis.  Rightward axis is new since 05/25/16.  Lab Results  Component Value Date  WBC 12.0 (H) 09/25/2015   HGB 12.6 (L) 09/25/2015   HCT 37.7 (L) 09/25/2015   MCV 89.8 09/25/2015   PLT 267 09/25/2015    Lab Results  Component Value Date   NA 140 07/10/2016   K 3.0 (L) 07/10/2016   CL 95 (L) 07/10/2016   CO2 35 (H) 07/10/2016   BUN 28 (H) 07/10/2016   CREATININE 1.15 07/10/2016   GLUCOSE 88 07/10/2016   ALT 16 07/10/2016    Lab Results  Component Value Date   CHOL 211 (H) 07/10/2016   HDL 50 07/10/2016   LDLCALC 118 07/10/2016   TRIG 215 (H) 07/10/2016   CHOLHDL 4.2 07/10/2016     --------------------------------------------------------------------------------------------------  ASSESSMENT AND PLAN: 1. Leg edema Patient has chronic leg swelling that is usually well-controlled when taking furosemide. He has fairly significant edema on exam today. His lungs are clear without elevated JVP, suggesting that he is not in decompensated heart failure. I suspect his leg swelling is likely multifactorial, including a component of chronic venous insufficiency. We will obtain a transthoracic echocardiogram for further evaluation, particularly in light of the murmur appreciated on exam today. The patient should remain on his current medications, including furosemide. Labs checked today were notable for hypokalemia. I have attempted to contact patient by phone to increase his potassium and am awaiting a  return call.  2. Chest pain, unspecified type The patient has exertional chest tightness and shortness of breath over the last 6-12 months, which seems to have gotten worse over the last few months. He attributes this to weight, but also notes that he has lost about 50 pounds given his multiple cardiac risk factors, it is reasonable to perform a myocardial perfusion stress test. We will check a lipid panel today for further risk stratification, as well as complete metabolic panel and creatine kinase. We will start aspirin 81 mg daily and atorvastatin 20 mg daily, given the patient's history diabetes and hypertension. We will address further up titration he returns for follow-up..  3. Leg pain Eric Houston notes pain in both thighs and calves with walking as well as when standing for extended periods. This may be arthritic given his obesity. ABIs in 2014 were normal. Patient reports having had repeat vascular studies of his lower extremities with last few weeks. I've asked him to bring these results with him when he returns for follow-up. As above, we'll check a creatine kinase today.  4.  Severe obesity (BMI >= 40) (HCC) I have congratulated Eric Houston on his weight loss. He remains morbidly obese. She continue with dietary restrictions. We will discuss exercise once he has completed a myocardial perfusion stress test.  Follow-up: Return to clinic in 6 weeks  Nelva Bush, MD 07/11/2016 9:07 AM

## 2016-07-10 ENCOUNTER — Telehealth: Payer: Self-pay | Admitting: Internal Medicine

## 2016-07-10 ENCOUNTER — Encounter: Payer: Self-pay | Admitting: Internal Medicine

## 2016-07-10 ENCOUNTER — Ambulatory Visit (INDEPENDENT_AMBULATORY_CARE_PROVIDER_SITE_OTHER): Payer: Medicare HMO | Admitting: Internal Medicine

## 2016-07-10 VITALS — BP 128/84 | HR 78 | Ht 65.5 in | Wt 303.6 lb

## 2016-07-10 DIAGNOSIS — R0602 Shortness of breath: Secondary | ICD-10-CM

## 2016-07-10 DIAGNOSIS — R079 Chest pain, unspecified: Secondary | ICD-10-CM | POA: Diagnosis not present

## 2016-07-10 DIAGNOSIS — R600 Localized edema: Secondary | ICD-10-CM | POA: Diagnosis not present

## 2016-07-10 DIAGNOSIS — E876 Hypokalemia: Secondary | ICD-10-CM

## 2016-07-10 LAB — COMPREHENSIVE METABOLIC PANEL
ALBUMIN: 4.2 g/dL (ref 3.6–5.1)
ALT: 16 U/L (ref 9–46)
AST: 21 U/L (ref 10–35)
Alkaline Phosphatase: 61 U/L (ref 40–115)
BILIRUBIN TOTAL: 0.5 mg/dL (ref 0.2–1.2)
BUN: 28 mg/dL — AB (ref 7–25)
CHLORIDE: 95 mmol/L — AB (ref 98–110)
CO2: 35 mmol/L — ABNORMAL HIGH (ref 20–31)
CREATININE: 1.15 mg/dL (ref 0.70–1.25)
Calcium: 9.3 mg/dL (ref 8.6–10.3)
Glucose, Bld: 88 mg/dL (ref 65–99)
Potassium: 3 mmol/L — ABNORMAL LOW (ref 3.5–5.3)
SODIUM: 140 mmol/L (ref 135–146)
TOTAL PROTEIN: 7.2 g/dL (ref 6.1–8.1)

## 2016-07-10 LAB — LIPID PANEL
Cholesterol: 211 mg/dL — ABNORMAL HIGH (ref 125–200)
HDL: 50 mg/dL (ref >=40)
LDL CALC: 118 mg/dL (ref <130)
Total CHOL/HDL Ratio: 4.2 Ratio (ref <=5.0)
Triglycerides: 215 mg/dL — ABNORMAL HIGH (ref <150)
VLDL: 43 mg/dL — ABNORMAL HIGH (ref <30)

## 2016-07-10 LAB — CK: CK TOTAL: 196 U/L (ref 7–232)

## 2016-07-10 MED ORDER — ASPIRIN EC 81 MG PO TBEC: 81.0000 mg | DELAYED_RELEASE_TABLET | Freq: Every day | ORAL | Status: DC

## 2016-07-10 MED ORDER — ATORVASTATIN CALCIUM 20 MG PO TABS: 20.0000 mg | ORAL_TABLET | Freq: Every day | ORAL | 3 refills | Status: AC

## 2016-07-10 NOTE — Telephone Encounter (Signed)
I attempted to call the patient to discuss hypokalemia (3.0) noted today but did not receive an answer.  I have instructed him to call me make address his low potassium.

## 2016-07-10 NOTE — Patient Instructions (Signed)
Medication Instructions:  Your physician has recommended you make the following change in your medication:  1) START Aspirin 81mg  daily 2) START Atorvastatin (Lipitor) 20mg  daily. An Rx has been sent to your pharmacy   Labwork: Lipid, Cmet, CK today  Testing/Procedures: Your physician has requested that you have an echocardiogram. Echocardiography is a painless test that uses sound waves to create images of your heart. It provides your doctor with information about the size and shape of your heart and how well your heart's chambers and valves are working. This procedure takes approximately one hour. There are no restrictions for this procedure.  Your physician has requested that you have a lexiscan myoview. For further information please visit [email or web address]. Please follow instruction sheet, as given.    Follow-Up: Your physician recommends that you schedule a follow-up appointment in: 6 weeks    Any Other Special Instructions Will Be Listed Below (If Applicable).     If you need a refill on your cardiac medications before your next appointment, please call your pharmacy.

## 2016-07-11 MED ORDER — POTASSIUM CHLORIDE CRYS ER 20 MEQ PO TBCR: 40.0000 meq | EXTENDED_RELEASE_TABLET | Freq: Every day | ORAL | Status: DC

## 2016-07-11 NOTE — Telephone Encounter (Signed)
I was able to reach Mr. Zong by phone today regarding his low potassium.  He is currently on KCl 20 meq daily while taking furosemide 80 mg daily for edema.  I have advised him to take KCl 40 meq BID today and tomorrow, followed by 40 meq daily.  We will have him return next week for repeat potassium level.  Nelva Bush, MD [facility] Pager: [phone]

## 2016-07-13 NOTE — Telephone Encounter (Signed)
I spoke to patient. I advised him of lab appt. He voiced understanding. He also states you were asking about some test results from PCP.  From what I gather he had an ABI done about 3 weeks ago at [facility] (Dr. Bernadette Hoit [phone]).  I left message on his nurse's voicemail for return call and requesting records. Lab appt made for Wednesday.

## 2016-07-13 NOTE — Telephone Encounter (Signed)
Routing to myself as well.

## 2016-07-15 ENCOUNTER — Other Ambulatory Visit: Payer: Medicare HMO

## 2016-07-30 ENCOUNTER — Ambulatory Visit (HOSPITAL_COMMUNITY): Payer: Medicare HMO

## 2016-07-30 ENCOUNTER — Other Ambulatory Visit (HOSPITAL_COMMUNITY): Payer: Medicare HMO

## 2016-07-31 ENCOUNTER — Ambulatory Visit (HOSPITAL_COMMUNITY): Payer: Medicare HMO

## 2016-08-09 ENCOUNTER — Inpatient Hospital Stay (HOSPITAL_COMMUNITY)
Admission: EM | Admit: 2016-08-09 | Discharge: 2016-08-13 | DRG: 596 | Disposition: A | Discharge status: Other Acute Inpatient Hospital | Payer: Medicare HMO | Attending: Internal Medicine | Admitting: Internal Medicine

## 2016-08-09 ENCOUNTER — Emergency Department (HOSPITAL_COMMUNITY): Payer: Medicare HMO

## 2016-08-09 ENCOUNTER — Encounter (HOSPITAL_COMMUNITY): Payer: Self-pay

## 2016-08-09 DIAGNOSIS — Z981 Arthrodesis status: Secondary | ICD-10-CM

## 2016-08-09 DIAGNOSIS — Z982 Presence of cerebrospinal fluid drainage device: Secondary | ICD-10-CM

## 2016-08-09 DIAGNOSIS — G4730 Sleep apnea, unspecified: Secondary | ICD-10-CM | POA: Diagnosis present

## 2016-08-09 DIAGNOSIS — I10 Essential (primary) hypertension: Secondary | ICD-10-CM | POA: Diagnosis present

## 2016-08-09 DIAGNOSIS — Z800 Family history of malignant neoplasm of digestive organs: Secondary | ICD-10-CM

## 2016-08-09 DIAGNOSIS — H1013 Acute atopic conjunctivitis, bilateral: Secondary | ICD-10-CM | POA: Diagnosis present

## 2016-08-09 DIAGNOSIS — D696 Thrombocytopenia, unspecified: Secondary | ICD-10-CM | POA: Diagnosis present

## 2016-08-09 DIAGNOSIS — Z79899 Other long term (current) drug therapy: Secondary | ICD-10-CM

## 2016-08-09 DIAGNOSIS — R21 Rash and other nonspecific skin eruption: Secondary | ICD-10-CM | POA: Diagnosis present

## 2016-08-09 DIAGNOSIS — Y92009 Unspecified place in unspecified non-institutional (private) residence as the place of occurrence of the external cause: Secondary | ICD-10-CM

## 2016-08-09 DIAGNOSIS — B349 Viral infection, unspecified: Secondary | ICD-10-CM | POA: Diagnosis present

## 2016-08-09 DIAGNOSIS — I872 Venous insufficiency (chronic) (peripheral): Secondary | ICD-10-CM | POA: Diagnosis present

## 2016-08-09 DIAGNOSIS — Z7982 Long term (current) use of aspirin: Secondary | ICD-10-CM

## 2016-08-09 DIAGNOSIS — M109 Gout, unspecified: Secondary | ICD-10-CM | POA: Diagnosis present

## 2016-08-09 DIAGNOSIS — Z87891 Personal history of nicotine dependence: Secondary | ICD-10-CM

## 2016-08-09 DIAGNOSIS — Z833 Family history of diabetes mellitus: Secondary | ICD-10-CM

## 2016-08-09 DIAGNOSIS — J029 Acute pharyngitis, unspecified: Secondary | ICD-10-CM | POA: Diagnosis present

## 2016-08-09 DIAGNOSIS — Z825 Family history of asthma and other chronic lower respiratory diseases: Secondary | ICD-10-CM

## 2016-08-09 DIAGNOSIS — E860 Dehydration: Secondary | ICD-10-CM | POA: Diagnosis present

## 2016-08-09 DIAGNOSIS — G919 Hydrocephalus, unspecified: Secondary | ICD-10-CM | POA: Diagnosis present

## 2016-08-09 DIAGNOSIS — E119 Type 2 diabetes mellitus without complications: Secondary | ICD-10-CM

## 2016-08-09 DIAGNOSIS — Z8249 Family history of ischemic heart disease and other diseases of the circulatory system: Secondary | ICD-10-CM

## 2016-08-09 DIAGNOSIS — T504X5A Adverse effect of drugs affecting uric acid metabolism, initial encounter: Secondary | ICD-10-CM | POA: Diagnosis present

## 2016-08-09 DIAGNOSIS — H109 Unspecified conjunctivitis: Secondary | ICD-10-CM | POA: Diagnosis present

## 2016-08-09 DIAGNOSIS — Z6841 Body Mass Index (BMI) 40.0 and over, adult: Secondary | ICD-10-CM

## 2016-08-09 DIAGNOSIS — L513 Stevens-Johnson syndrome-toxic epidermal necrolysis overlap syndrome: Principal | ICD-10-CM | POA: Diagnosis present

## 2016-08-09 DIAGNOSIS — R600 Localized edema: Secondary | ICD-10-CM | POA: Diagnosis present

## 2016-08-09 DIAGNOSIS — T7840XA Allergy, unspecified, initial encounter: Secondary | ICD-10-CM

## 2016-08-09 DIAGNOSIS — G4733 Obstructive sleep apnea (adult) (pediatric): Secondary | ICD-10-CM | POA: Diagnosis present

## 2016-08-09 DIAGNOSIS — J449 Chronic obstructive pulmonary disease, unspecified: Secondary | ICD-10-CM | POA: Diagnosis present

## 2016-08-09 DIAGNOSIS — L511 Stevens-Johnson syndrome: Secondary | ICD-10-CM

## 2016-08-09 LAB — URINALYSIS, ROUTINE W REFLEX MICROSCOPIC
GLUCOSE, UA: NEGATIVE mg/dL
KETONES UR: NEGATIVE mg/dL
Leukocytes, UA: NEGATIVE
NITRITE: NEGATIVE
PH: 6 (ref 5.0–8.0)
Protein, ur: 100 mg/dL — AB
Specific Gravity, Urine: 1.029 (ref 1.005–1.030)

## 2016-08-09 LAB — PROTIME-INR
INR: 1.03
PROTHROMBIN TIME: 13.5 s (ref 11.4–15.2)

## 2016-08-09 LAB — CBC WITH DIFFERENTIAL/PLATELET
BASOS ABS: 0.1 10*3/uL (ref 0.0–0.1)
BASOS PCT: 1 %
Eosinophils Absolute: 0.4 10*3/uL (ref 0.0–0.7)
Eosinophils Relative: 5 %
HEMATOCRIT: 39.5 % (ref 39.0–52.0)
HEMOGLOBIN: 13.8 g/dL (ref 13.0–17.0)
Lymphocytes Relative: 16 %
Lymphs Abs: 1.2 10*3/uL (ref 0.7–4.0)
MCH: 31.3 pg (ref 26.0–34.0)
MCHC: 34.9 g/dL (ref 30.0–36.0)
MCV: 89.6 fL (ref 78.0–100.0)
Monocytes Absolute: 0.9 10*3/uL (ref 0.1–1.0)
Monocytes Relative: 11 %
NEUTROS ABS: 5.3 10*3/uL (ref 1.7–7.7)
NEUTROS PCT: 67 %
Platelets: 122 10*3/uL — ABNORMAL LOW (ref 150–400)
RBC: 4.41 MIL/uL (ref 4.22–5.81)
RDW: 13.6 % (ref 11.5–15.5)
WBC: 7.9 10*3/uL (ref 4.0–10.5)

## 2016-08-09 LAB — COMPREHENSIVE METABOLIC PANEL
ALBUMIN: 3.5 g/dL (ref 3.5–5.0)
ALT: 55 U/L (ref 17–63)
ANION GAP: 13 (ref 5–15)
AST: 78 U/L — ABNORMAL HIGH (ref 15–41)
Alkaline Phosphatase: 63 U/L (ref 38–126)
BILIRUBIN TOTAL: 1.3 mg/dL — AB (ref 0.3–1.2)
BUN: 13 mg/dL (ref 6–20)
CO2: 25 mmol/L (ref 22–32)
Calcium: 8.7 mg/dL — ABNORMAL LOW (ref 8.9–10.3)
Chloride: 98 mmol/L — ABNORMAL LOW (ref 101–111)
Creatinine, Ser: 1.02 mg/dL (ref 0.61–1.24)
Glucose, Bld: 121 mg/dL — ABNORMAL HIGH (ref 65–99)
POTASSIUM: 3.5 mmol/L (ref 3.5–5.1)
Sodium: 136 mmol/L (ref 135–145)
TOTAL PROTEIN: 6.6 g/dL (ref 6.5–8.1)

## 2016-08-09 LAB — URINE MICROSCOPIC-ADD ON: Bacteria, UA: NONE SEEN

## 2016-08-09 LAB — GLUCOSE, CAPILLARY
GLUCOSE-CAPILLARY: 139 mg/dL — AB (ref 65–99)
Glucose-Capillary: 184 mg/dL — ABNORMAL HIGH (ref 65–99)

## 2016-08-09 LAB — LIPASE, BLOOD: LIPASE: 27 U/L (ref 11–51)

## 2016-08-09 LAB — INFLUENZA PANEL BY PCR (TYPE A & B)
INFLAPCR: NEGATIVE
INFLBPCR: NEGATIVE

## 2016-08-09 LAB — RAPID STREP SCREEN (MED CTR MEBANE ONLY): STREPTOCOCCUS, GROUP A SCREEN (DIRECT): NEGATIVE

## 2016-08-09 MED ORDER — GUAIFENESIN-DM 100-10 MG/5ML PO SYRP
5.0000 mL | ORAL_SOLUTION | ORAL | Status: DC | PRN
  Administered 2016-08-12: 5 mL via ORAL
  Filled 2016-08-09: qty 5

## 2016-08-09 MED ORDER — ONDANSETRON HCL 4 MG/2ML IJ SOLN: 4.0000 mg | Freq: Four times a day (QID) | INTRAMUSCULAR | Status: DC | PRN

## 2016-08-09 MED ORDER — DIPHENHYDRAMINE HCL 50 MG/ML IJ SOLN
25.0000 mg | Freq: Once | INTRAMUSCULAR | Status: AC
  Administered 2016-08-09: 25 mg via INTRAVENOUS
  Filled 2016-08-09: qty 1

## 2016-08-09 MED ORDER — OXYCODONE-ACETAMINOPHEN 5-325 MG PO TABS
1.0000 | ORAL_TABLET | Freq: Three times a day (TID) | ORAL | Status: DC | PRN
  Administered 2016-08-11: 1 via ORAL
  Filled 2016-08-09: qty 1

## 2016-08-09 MED ORDER — MOMETASONE FURO-FORMOTEROL FUM 200-5 MCG/ACT IN AERO
2.0000 | INHALATION_SPRAY | Freq: Two times a day (BID) | RESPIRATORY_TRACT | Status: DC
  Administered 2016-08-09 – 2016-08-13 (×9): 2 via RESPIRATORY_TRACT
  Filled 2016-08-09: qty 8.8

## 2016-08-09 MED ORDER — INSULIN ASPART 100 UNIT/ML ~~LOC~~ SOLN: 0.0000 [IU] | Freq: Every day | SUBCUTANEOUS | Status: DC

## 2016-08-09 MED ORDER — ENOXAPARIN SODIUM 40 MG/0.4ML ~~LOC~~ SOLN
40.0000 mg | SUBCUTANEOUS | Status: DC
  Administered 2016-08-09 – 2016-08-13 (×5): 40 mg via SUBCUTANEOUS
  Filled 2016-08-09 (×5): qty 0.4

## 2016-08-09 MED ORDER — ACETAMINOPHEN 325 MG PO TABS
650.0000 mg | ORAL_TABLET | Freq: Four times a day (QID) | ORAL | Status: DC | PRN
  Administered 2016-08-11: 650 mg via ORAL

## 2016-08-09 MED ORDER — ALBUTEROL SULFATE HFA 108 (90 BASE) MCG/ACT IN AERS: 2.0000 | INHALATION_SPRAY | RESPIRATORY_TRACT | Status: DC | PRN

## 2016-08-09 MED ORDER — FUROSEMIDE 80 MG PO TABS
80.0000 mg | ORAL_TABLET | Freq: Every day | ORAL | Status: DC
  Administered 2016-08-10 – 2016-08-12 (×3): 80 mg via ORAL
  Filled 2016-08-09 (×3): qty 1

## 2016-08-09 MED ORDER — LOSARTAN POTASSIUM 50 MG PO TABS
50.0000 mg | ORAL_TABLET | Freq: Every day | ORAL | Status: DC
  Administered 2016-08-09 – 2016-08-13 (×5): 50 mg via ORAL
  Filled 2016-08-09 (×5): qty 1

## 2016-08-09 MED ORDER — PANTOPRAZOLE SODIUM 40 MG PO TBEC
40.0000 mg | DELAYED_RELEASE_TABLET | Freq: Every day | ORAL | Status: DC
  Administered 2016-08-09 – 2016-08-13 (×5): 40 mg via ORAL
  Filled 2016-08-09 (×5): qty 1

## 2016-08-09 MED ORDER — ASPIRIN EC 81 MG PO TBEC
81.0000 mg | DELAYED_RELEASE_TABLET | Freq: Every day | ORAL | Status: DC
  Administered 2016-08-09 – 2016-08-13 (×5): 81 mg via ORAL
  Filled 2016-08-09 (×5): qty 1

## 2016-08-09 MED ORDER — ALBUTEROL SULFATE (2.5 MG/3ML) 0.083% IN NEBU: 2.5000 mg | INHALATION_SOLUTION | RESPIRATORY_TRACT | Status: DC | PRN

## 2016-08-09 MED ORDER — INSULIN ASPART 100 UNIT/ML ~~LOC~~ SOLN
0.0000 [IU] | Freq: Three times a day (TID) | SUBCUTANEOUS | Status: DC
  Administered 2016-08-09 – 2016-08-10 (×3): 1 [IU] via SUBCUTANEOUS
  Administered 2016-08-10: 2 [IU] via SUBCUTANEOUS
  Administered 2016-08-11 – 2016-08-13 (×6): 1 [IU] via SUBCUTANEOUS

## 2016-08-09 MED ORDER — ACETAMINOPHEN 650 MG RE SUPP: 650.0000 mg | Freq: Four times a day (QID) | RECTAL | Status: DC | PRN

## 2016-08-09 MED ORDER — ALLOPURINOL 300 MG PO TABS
300.0000 mg | ORAL_TABLET | Freq: Every day | ORAL | Status: DC
  Administered 2016-08-09 – 2016-08-12 (×4): 300 mg via ORAL
  Filled 2016-08-09 (×4): qty 1

## 2016-08-09 MED ORDER — TOBRAMYCIN 0.3 % OP OINT: TOPICAL_OINTMENT | Freq: Two times a day (BID) | OPHTHALMIC | Status: DC

## 2016-08-09 MED ORDER — DIPHENHYDRAMINE HCL 50 MG/ML IJ SOLN: 12.5000 mg | Freq: Four times a day (QID) | INTRAMUSCULAR | Status: DC | PRN

## 2016-08-09 MED ORDER — POTASSIUM CHLORIDE CRYS ER 20 MEQ PO TBCR
40.0000 meq | EXTENDED_RELEASE_TABLET | Freq: Every day | ORAL | Status: DC
  Administered 2016-08-09 – 2016-08-13 (×5): 40 meq via ORAL
  Filled 2016-08-09 (×5): qty 2

## 2016-08-09 MED ORDER — OXYCODONE-ACETAMINOPHEN 10-325 MG PO TABS: 10.0000 | ORAL_TABLET | Freq: Three times a day (TID) | ORAL | Status: DC | PRN

## 2016-08-09 MED ORDER — FAMOTIDINE IN NACL 20-0.9 MG/50ML-% IV SOLN
20.0000 mg | Freq: Once | INTRAVENOUS | Status: AC
  Administered 2016-08-09: 20 mg via INTRAVENOUS
  Filled 2016-08-09: qty 50

## 2016-08-09 MED ORDER — METHYLPREDNISOLONE SODIUM SUCC 125 MG IJ SOLR
125.0000 mg | Freq: Once | INTRAMUSCULAR | Status: AC
  Administered 2016-08-09: 125 mg via INTRAVENOUS
  Filled 2016-08-09: qty 2

## 2016-08-09 MED ORDER — CEPASTAT 14.5 MG MT LOZG
1.0000 | LOZENGE | OROMUCOSAL | Status: DC | PRN
  Administered 2016-08-10: 1 via BUCCAL
  Filled 2016-08-09 (×2): qty 9

## 2016-08-09 MED ORDER — PENICILLIN V POTASSIUM 250 MG/5ML PO SOLR
500.0000 mg | Freq: Two times a day (BID) | ORAL | Status: DC
  Administered 2016-08-09 – 2016-08-11 (×4): 500 mg via ORAL
  Filled 2016-08-09 (×4): qty 10

## 2016-08-09 MED ORDER — ONDANSETRON HCL 4 MG/2ML IJ SOLN
4.0000 mg | Freq: Once | INTRAMUSCULAR | Status: AC
  Administered 2016-08-09: 4 mg via INTRAVENOUS
  Filled 2016-08-09: qty 2

## 2016-08-09 MED ORDER — OXYCODONE HCL 5 MG PO TABS
5.0000 mg | ORAL_TABLET | Freq: Three times a day (TID) | ORAL | Status: DC | PRN
  Filled 2016-08-09: qty 1

## 2016-08-09 MED ORDER — ONDANSETRON HCL 4 MG PO TABS: 4.0000 mg | ORAL_TABLET | Freq: Four times a day (QID) | ORAL | Status: DC | PRN

## 2016-08-09 MED ORDER — SODIUM CHLORIDE 0.9 % IV BOLUS (SEPSIS)
500.0000 mL | Freq: Once | INTRAVENOUS | Status: AC
  Administered 2016-08-09: 500 mL via INTRAVENOUS

## 2016-08-09 MED ORDER — ALBUTEROL SULFATE (2.5 MG/3ML) 0.083% IN NEBU
5.0000 mg | INHALATION_SOLUTION | Freq: Once | RESPIRATORY_TRACT | Status: AC
  Administered 2016-08-09: 5 mg via RESPIRATORY_TRACT
  Filled 2016-08-09: qty 6

## 2016-08-09 MED ORDER — METHYLPREDNISOLONE SODIUM SUCC 125 MG IJ SOLR
60.0000 mg | Freq: Two times a day (BID) | INTRAMUSCULAR | Status: DC
  Administered 2016-08-09 – 2016-08-11 (×4): 60 mg via INTRAVENOUS
  Filled 2016-08-09 (×5): qty 2

## 2016-08-09 MED ORDER — ATORVASTATIN CALCIUM 20 MG PO TABS
20.0000 mg | ORAL_TABLET | Freq: Every day | ORAL | Status: DC
  Administered 2016-08-09 – 2016-08-13 (×5): 20 mg via ORAL
  Filled 2016-08-09 (×5): qty 1

## 2016-08-09 MED ORDER — SODIUM CHLORIDE 0.9 % IV SOLN
INTRAVENOUS | Status: DC
  Administered 2016-08-09 – 2016-08-11 (×4): via INTRAVENOUS
  Administered 2016-08-12: 1000 mL via INTRAVENOUS
  Administered 2016-08-13: 09:00:00 via INTRAVENOUS

## 2016-08-09 NOTE — ED Notes (Signed)
Pt reports cough, sore throat, eye redness and swelling x 4 days. Pt states throat feels significantly worse today. Pt noted rash on upper body yesterday that has spread and is worse today. Macular rash on chest, back and bilateral upper extremities.

## 2016-08-09 NOTE — ED Triage Notes (Signed)
Patient here with 2 days of feeling like throat swollen and difficulty swallowing with increased cough and shortness of breath. Denies CP. Patient states that he feels pain radiating to left side of neck when he swallows, NAD

## 2016-08-09 NOTE — ED Notes (Signed)
Attempted report to floor x 1 

## 2016-08-09 NOTE — Progress Notes (Signed)
Patient refuses to wear CPAP HS. No distress  Noted. Nurse aware.

## 2016-08-09 NOTE — ED Notes (Signed)
Admitting at bedside 

## 2016-08-09 NOTE — H&P (Signed)
History and Physical    Eric Houston [id number] DOB: 07/18/1956 DOA: 08/09/2016  PCP: Alphonzo Lemmings, MD Patient coming from: home  Chief Complaint: sore throat/cough/eye redness and swelling  HPI: Eric Houston is a very pleasant 60 y.o. male with medical history significant for diabetes, obesity, sleep apnea on C Pap, hypertension, hydrocephalus status post shunt, COPD not on home oxygen presents to the emergency department with the chief complaint of persistent sore throat/cough/I redness and swelling. Initial evaluation in the emergency department reveals echo picture concerning for dehydration, systemic viral illness  Information is obtained from the patient and the chart. He reports being in his usual state of health until 5 days ago he developed gradual onset of upper respiratory infection symptoms. Started with a headache nasal congestion that persisted and worsened. He then developed sore throat dry cough, bilateral eye redness with drainage and swelling. Reports decreased oral intake due to severe pain with swallowing. Yesterday he developed a rash upper body bilateral arms, neck face lips ears. He denies any blistering or peeling of the skin. He denies headache visual disturbances numbness tingling of extremities. He denies fever chills nausea vomiting diarrhea. He denies chest pain palpitations shortness of breath. He does have a history of COPD and sleep apnea and wears sleep apnea at night but does not have home oxygen. He denies worsening lower extremity edema and endorses chronic low back pain.    ED Course: In the emergency department max temperature 99.2 these hemodynamically stable and initially tachycardic. He is provided with 500 mL of normal saline and heart rate improves. He is provided with Solu-Medrol, Benadryl and Pepcid. The time of admission he appears stable but acutely ill  Review of Systems: As per HPI otherwise 10 point review of systems negative.    Ambulatory Status: He ambulates independently recent falls  Past Medical History:  Diagnosis Date  . Arthritis   . Asthmatic bronchitis   . COPD (chronic obstructive pulmonary disease) ([place])   . Diabetes mellitus type II   . Headache   . Hydrocephalus    vp shunt  . Hypertension   . Shortness of breath dyspnea   . Sleep apnea     Past Surgical History:  Procedure Laterality Date  . neck fusion    . TONSILLECTOMY    . VENTRICULOPERITONEAL SHUNT     x 2    Social History   Social History  . Marital status: Legally Separated    Spouse name: N/A  . Number of children: 2  . Years of education: N/A   Occupational History  . Disability    Social History Main Topics  . Smoking status: Former Smoker    Packs/day: 2.00    Years: 2.00    Types: Cigarettes    Quit date: 11/14/1984  . Smokeless tobacco: Never Used  . Alcohol use 2.4 oz/week    4 Shots of liquor per week     Comment: mixed drinks on the weekend  . Drug use: No  . Sexual activity: Not on file   Other Topics Concern  . Not on file   Social History Narrative  . No narrative on file    Allergies  Allergen Reactions  . Sulfa Antibiotics     rash    Family History  Problem Relation Age of Onset  . Emphysema Father     smoked  . Diabetes Father   . Heart disease Father   . Heart attack Father   .  Cancer Mother     pancreatic  . Diabetes Mother   . Heart disease Mother     Born with hole in the heart  . Stroke Mother   . Diabetes Brother     Prior to Admission medications   Medication Sig Start Date End Date Taking? Authorizing Provider  albuterol (VENTOLIN HFA) 108 (90 BASE) MCG/ACT inhaler Inhale 2 puffs into the lungs every 4 (four) hours as needed.    Yes Historical Provider, MD  allopurinol (ZYLOPRIM) 300 MG tablet Take 300 mg by mouth daily. 08/06/16  Yes Historical Provider, MD  aspirin EC 81 MG tablet Take 1 tablet (81 mg total) by mouth daily. 07/10/16  Yes Christopher End, MD   atorvastatin (LIPITOR) 20 MG tablet Take 1 tablet (20 mg total) by mouth daily. 07/10/16 10/08/16 Yes Christopher End, MD  budesonide-formoterol (SYMBICORT) 160-4.5 MCG/ACT inhaler Inhale 2 puffs into the lungs 2 (two) times daily. 09/26/15  Yes Barton Dubois, MD  furosemide (LASIX) 80 MG tablet Take 80 mg by mouth daily.    Yes Historical Provider, MD  guaiFENesin-dextromethorphan (ROBITUSSIN DM) 100-10 MG/5ML syrup Take 5 mLs by mouth every 4 (four) hours as needed for cough. 09/26/15  Yes Barton Dubois, MD  losartan (COZAAR) 50 MG tablet Take 50 mg by mouth daily. 07/25/16  Yes Historical Provider, MD  metFORMIN (GLUCOPHAGE) 500 MG tablet Take 500 mg by mouth 2 (two) times daily. 06/23/16  Yes Historical Provider, MD  omeprazole (PRILOSEC) 20 MG capsule Take 20 mg by mouth daily.   Yes Historical Provider, MD  oxyCODONE-acetaminophen (PERCOCET) 10-325 MG tablet Take 10-325 tablets by mouth every 8 (eight) hours as needed for pain. 06/30/16  Yes Historical Provider, MD  pioglitazone (ACTOS) 45 MG tablet Take 45 mg by mouth daily. 06/15/16  Yes Historical Provider, MD  potassium chloride SA (K-DUR,KLOR-CON) 20 MEQ tablet Take 2 tablets (40 mEq total) by mouth daily. 07/11/16  Yes Nelva Bush, MD    Physical Exam: Vitals:   08/09/16 1400 08/09/16 1430 08/09/16 1500 08/09/16 1530  BP: 132/75 137/71 122/82 127/74  Pulse: 92 101 83 88  Resp: 18 16 16 16   Temp:      TempSrc:      SpO2: 96% 95% 100% 94%     General:  Appears Somewhat anxious and slightly uncomfortable sitting up in bed Eyes: Bilateral swelling of eyes with yellow drainage crustiness around eyelids, sclera red right greater than left ENT:  grossly normal hearing, lips dry, throat with erythema, swelling, no tonsils, Uvula with white patches Neck:  no LAD, masses or thyromegaly Cardiovascular:  RRR, + murmur. trace LE edema.  Respiratory:  CTA bilaterally, no w/r/r. Normal respiratory effort. Abdomen:  soft, obese positive  bowel sounds no guarding or rebounding Skin:  Upper torso, bilateral arms, neck head, ears, lips with macular rash. Warm to touch, no blisters or peeling, non-tender. Rash does blanch. Mucus membranes of mouth as noted above Musculoskeletal:  grossly normal tone BUE/BLE, good ROM, no bony abnormality Psychiatric:  grossly normal mood and affect, speech fluent and appropriate, AOx3 Neurologic:  CN 2-12 grossly intact, moves all extremities in coordinated fashion, sensation intact  Labs on Admission: I have personally reviewed following labs and imaging studies  CBC:  Recent Labs Lab 08/09/16 1007  WBC 7.9  NEUTROABS 5.3  HGB 13.8  HCT 39.5  MCV 89.6  PLT 123XX123*   Basic Metabolic Panel:  Recent Labs Lab 08/09/16 1007  NA 136  K 3.5  CL 98*  CO2 25  GLUCOSE 121*  BUN 13  CREATININE 1.02  CALCIUM 8.7*   GFR: CrCl cannot be calculated (Unknown ideal weight.). Liver Function Tests:  Recent Labs Lab 08/09/16 1007  AST 78*  ALT 55  ALKPHOS 63  BILITOT 1.3*  PROT 6.6  ALBUMIN 3.5    Recent Labs Lab 08/09/16 1007  LIPASE 27   No results for input(s): AMMONIA in the last 168 hours. Coagulation Profile:  Recent Labs Lab 08/09/16 1007  INR 1.03   Cardiac Enzymes: No results for input(s): CKTOTAL, CKMB, CKMBINDEX, TROPONINI in the last 168 hours. BNP (last 3 results) No results for input(s): PROBNP in the last 8760 hours. HbA1C: No results for input(s): HGBA1C in the last 72 hours. CBG: No results for input(s): GLUCAP in the last 168 hours. Lipid Profile: No results for input(s): CHOL, HDL, LDLCALC, TRIG, CHOLHDL, LDLDIRECT in the last 72 hours. Thyroid Function Tests: No results for input(s): TSH, T4TOTAL, FREET4, T3FREE, THYROIDAB in the last 72 hours. Anemia Panel: No results for input(s): VITAMINB12, FOLATE, FERRITIN, TIBC, IRON, RETICCTPCT in the last 72 hours. Urine analysis:    Component Value Date/Time   COLORURINE ORANGE (A) 08/09/2016 1255    APPEARANCEUR CLEAR 08/09/2016 1255   LABSPEC 1.029 08/09/2016 1255   PHURINE 6.0 08/09/2016 1255   GLUCOSEU NEGATIVE 08/09/2016 1255   HGBUR SMALL (A) 08/09/2016 1255   BILIRUBINUR SMALL (A) 08/09/2016 1255   KETONESUR NEGATIVE 08/09/2016 1255   PROTEINUR 100 (A) 08/09/2016 1255   NITRITE NEGATIVE 08/09/2016 1255   LEUKOCYTESUR NEGATIVE 08/09/2016 1255    Creatinine Clearance: CrCl cannot be calculated (Unknown ideal weight.).  Sepsis Labs: @LABRCNTIP (procalcitonin:4,lacticidven:4) ) Recent Results (from the past 240 hour(s))  Rapid strep screen (not at [facility])     Status: None   Collection Time: 08/09/16 10:10 AM  Result Value Ref Range Status   Streptococcus, Group A Screen (Direct) NEGATIVE NEGATIVE Final    Comment: (NOTE) A Rapid Antigen test may result negative if the antigen level in the sample is below the detection level of this test. The FDA has not cleared this test as a stand-alone test therefore the rapid antigen negative result has reflexed to a Group A Strep culture.      Radiological Exams on Admission: Dg Chest 2 View  Result Date: 08/09/2016 CLINICAL DATA:  Patient with chronic shortness of breath and cough. Worsening for 5 days. EXAM: CHEST  2 VIEW COMPARISON:  Chest radiograph 09/24/2015. FINDINGS: Showing catheter projects over the right upper hemi thorax. Anterior cervical spinal fusion hardware. Stable enlarged cardiac and mediastinal contours. No consolidative pulmonary opacities. No pleural effusion or pneumothorax. Mid thoracic spine degenerative changes. IMPRESSION: No acute cardiopulmonary process. Electronically Signed   By: Lovey Newcomer M.D.   On: 08/09/2016 08:57    EKG: Independently reviewed. Normal sinus rhythm Rightward axis Borderline ECG  Assessment/Plan Principal Problem:   Viral illness Active Problems:   Essential hypertension   Diabetes mellitus, type 2 ([facility])   Sleep apnea   Hydrocephalus   Severe obesity (BMI >= 40) ([facility])    Conjunctivitis of both eyes   Rash   Sore throat   Dehydration   COPD (chronic obstructive pulmonary disease) ([facility])   Thrombocytopenia ([facility])   Bilateral lower extremity edema   #1. Viral illness/sore throat. Max temp 99. No leukocytosis. Symptoms consistent with upper respiratory viral illness. Rapid strep negative. Chest xray no acute cardiopulmonary process. Labs unremarkable. -admit to medsurg -IV fluids -supportive therapy -resp isolation  #2.  Rash. Etiology unclear.  May be related to #1 making it concerning for measles among others. ? Erythema multiform. Rapid strep test negative. RPR pending. Flu PCR pending.  Started on allopurinol last week for gout. Had dye for vascular test last week as well. Appears acutely ill but not toxic. Discussed with ID who recommended rubella screen and rubella igm and antibiotic coverage -PCR measles -continue solumedrol -rubella screen -rubella igm -continue benadryl and pepcid -ID to see tomorrow (11/13)  #3. Conjunctivitis of both eyes. Related to above -droplett and contact isolation -tobramycin ointment -monitor  #4. Diabetes. On oral agents. Serum glucose 121 on admission -Hold oral agents for now -Sliding scale for optimal control -Obtain a hemoglobin A1c  #5. Hypertension. Fair control in the emergency department. Home medications include Lasix, Cozaar -Hold Lasix for now -We'll resume home Cozaar -Monitor  #6. Sleep apnea. Wears C Pap at home -Respiratory consult for C Pap  #7. History of hydrocephalus status post shunt. Stable at baseline  #8. COPD. Stable. Not on oxygen at home. Does use home inhalers. Chest x-ray as noted above. Oxygen saturation level greater than 90% on room air -Continue home meds -Monitor oxygen saturation level -supplemental oxygen as needed  #9. Thrombocytopenia. Platelet level 122.no s/sx bleeding. Likely related to #1.  -monitor  #10. Bilateral lower extremity edema. Chronic. Pierce stable at  baseline. Medications include Lasix. Chart review indicates evaluated by vascular last month. Office note indicates they opined leg swelling likely multifactorial including a component of chronic venous insufficiency. Echo ordered. Of note test is "active" in epic. No results -resume lasix tomorrow.     DVT prophylaxis: scd Code Status: full Family Communication: none present  Disposition Plan: home  Consults called: dr Megan Salon ID Admission status: obs    Radene Gunning MD [facility]  If 7PM-7AM, please contact night-coverage [email or web address] Password [email or web address]  08/09/2016, 4:01 PM

## 2016-08-09 NOTE — ED Notes (Signed)
Dr. Zackowski at bedside  

## 2016-08-09 NOTE — ED Provider Notes (Signed)
[facility] DEPT Provider Note   CSN: [id number] Arrival date & time: 08/09/16  [id number]     History   Chief Complaint Chief Complaint  Patient presents with  . Sore Throat  . Cough    HPI Eric Houston is a 60 y.o. male.  Patient status post vascular surgery procedure on Tuesday where dye was injected. That evening he started with sore throat headache and dry cough. Wednesday morning he had a bilateral conjunctivitis redness to the eyes and discharge. Not itchy not painful. Associated with some nausea no diarrhea no vomiting. On Friday a rash started More extensive yesterday. Does have a sore throat more so on the left side. But able to swallow okay. Rash is not itchy. Patient started on a new medication for gout about a week ago.      Past Medical History:  Diagnosis Date  . Arthritis   . Asthmatic bronchitis   . COPD (chronic obstructive pulmonary disease) ([place])   . Diabetes mellitus type II   . Headache   . Hydrocephalus    vp shunt  . Hypertension   . Shortness of breath dyspnea   . Sleep apnea     Patient Active Problem List   Diagnosis Date Noted  . Conjunctivitis of both eyes 08/09/2016  . Rash 08/09/2016  . Sore throat 08/09/2016  . Viral illness 08/09/2016  . Dehydration 08/09/2016  . Severe obesity (BMI >= 40) ([place]) 11/06/2015  . Type 2 diabetes mellitus with hyperglycemia, without long-term current use of insulin ([place])   . Dyspnea 09/24/2015  . Asthma 09/24/2015  . Well controlled diabetes mellitus ([place])   . Essential hypertension   . Diabetes mellitus, type 2 ([place])   . Asthmatic bronchitis   . Sleep apnea   . Hydrocephalus   . PLANTAR FASCIITIS, LEFT 11/28/2008  . HEEL PAIN, LEFT 11/28/2008  . PES PLANUS 11/28/2008  . SHOULDER PAIN, LEFT 10/03/2008  . KNEE PAIN, BILATERAL 10/03/2008    Past Surgical History:  Procedure Laterality Date  . neck fusion    . TONSILLECTOMY    . VENTRICULOPERITONEAL SHUNT     x 2    OB History    No  data available       Home Medications    Prior to Admission medications   Medication Sig Start Date End Date Taking? Authorizing Provider  albuterol (VENTOLIN HFA) 108 (90 BASE) MCG/ACT inhaler Inhale 2 puffs into the lungs every 4 (four) hours as needed.    Yes Historical Provider, MD  allopurinol (ZYLOPRIM) 300 MG tablet Take 300 mg by mouth daily. 08/06/16  Yes Historical Provider, MD  aspirin EC 81 MG tablet Take 1 tablet (81 mg total) by mouth daily. 07/10/16  Yes Christopher End, MD  atorvastatin (LIPITOR) 20 MG tablet Take 1 tablet (20 mg total) by mouth daily. 07/10/16 10/08/16 Yes Christopher End, MD  budesonide-formoterol (SYMBICORT) 160-4.5 MCG/ACT inhaler Inhale 2 puffs into the lungs 2 (two) times daily. 09/26/15  Yes Barton Dubois, MD  furosemide (LASIX) 80 MG tablet Take 80 mg by mouth daily.    Yes Historical Provider, MD  guaiFENesin-dextromethorphan (ROBITUSSIN DM) 100-10 MG/5ML syrup Take 5 mLs by mouth every 4 (four) hours as needed for cough. 09/26/15  Yes Barton Dubois, MD  losartan (COZAAR) 50 MG tablet Take 50 mg by mouth daily. 07/25/16  Yes Historical Provider, MD  metFORMIN (GLUCOPHAGE) 500 MG tablet Take 500 mg by mouth 2 (two) times daily. 06/23/16  Yes Historical Provider, MD  omeprazole (PRILOSEC) 20 MG capsule Take 20 mg by mouth daily.   Yes Historical Provider, MD  oxyCODONE-acetaminophen (PERCOCET) 10-325 MG tablet Take 10-325 tablets by mouth every 8 (eight) hours as needed for pain. 06/30/16  Yes Historical Provider, MD  pioglitazone (ACTOS) 45 MG tablet Take 45 mg by mouth daily. 06/15/16  Yes Historical Provider, MD  potassium chloride SA (K-DUR,KLOR-CON) 20 MEQ tablet Take 2 tablets (40 mEq total) by mouth daily. 07/11/16  Yes Nelva Bush, MD    Family History Family History  Problem Relation Age of Onset  . Emphysema Father     smoked  . Diabetes Father   . Heart disease Father   . Heart attack Father   . Cancer Mother     pancreatic  .  Diabetes Mother   . Heart disease Mother     Born with hole in the heart  . Stroke Mother   . Diabetes Brother     Social History Social History  Substance Use Topics  . Smoking status: Former Smoker    Packs/day: 2.00    Years: 2.00    Types: Cigarettes    Quit date: 11/14/1984  . Smokeless tobacco: Never Used  . Alcohol use 2.4 oz/week    4 Shots of liquor per week     Comment: mixed drinks on the weekend     Allergies   Sulfa antibiotics   Review of Systems Review of Systems  Constitutional: Positive for fatigue and fever.  HENT: Positive for sore throat. Negative for congestion.   Eyes: Positive for discharge and redness. Negative for pain and itching.  Respiratory: Negative for shortness of breath.   Cardiovascular: Negative for chest pain.  Gastrointestinal: Negative for abdominal pain, diarrhea, nausea and vomiting.  Genitourinary: Negative for dysuria and hematuria.  Musculoskeletal: Positive for myalgias. Negative for neck stiffness.  Skin: Positive for rash.  Neurological: Negative for headaches.  Hematological: Does not bruise/bleed easily.  Psychiatric/Behavioral: Negative for confusion.     Physical Exam Updated Vital Signs BP 122/82   Pulse 83   Temp 99.2 F (37.3 C) (Oral)   Resp 16   SpO2 100%   Physical Exam  Constitutional: He is oriented to person, place, and time. He appears well-developed and well-nourished. He appears distressed.  HENT:  Head: Normocephalic and atraumatic.  Mouth/Throat: Oropharynx is clear and moist. No oropharyngeal exudate.  Posterior pharynx very erythematous. No oral lesions. No exudate. Uvula midline. Some swelling noted to the uvula.  Eyes: EOM are normal. Pupils are equal, round, and reactive to light.  Neck: Normal range of motion. Neck supple.  Cardiovascular: Regular rhythm.   No murmur heard. Tachycardic.  Pulmonary/Chest: Effort normal and breath sounds normal. No respiratory distress. He has no wheezes.  He has no rales.  Shallow respirations.  Abdominal: Soft. Bowel sounds are normal. There is no tenderness.  Musculoskeletal: Normal range of motion. He exhibits edema.  Bilateral leg edema mild pitting.  Neurological: He is alert and oriented to person, place, and time. No cranial nerve deficit or sensory deficit. He exhibits normal muscle tone. Coordination normal.  Skin: Skin is warm. Capillary refill takes less than 2 seconds. Rash noted.  Extensive macular rash. Almost, lucent on the face and head. An anterior part of the neck and chest. On the arms has multiple shapes. Questionable target lesions. Does not peel no vesicles no blistering. Does blanch. Appears to be some involvement on the palms of the hands.  Nursing note and vitals reviewed.  ED Treatments / Results  Labs (all labs ordered are listed, but only abnormal results are displayed) Labs Reviewed  URINALYSIS, ROUTINE W REFLEX MICROSCOPIC (NOT AT [facility]) - Abnormal; Notable for the following:       Result Value   Color, Urine ORANGE (*)    Hgb urine dipstick SMALL (*)    Bilirubin Urine SMALL (*)    Protein, ur 100 (*)    All other components within normal limits  COMPREHENSIVE METABOLIC PANEL - Abnormal; Notable for the following:    Chloride 98 (*)    Glucose, Bld 121 (*)    Calcium 8.7 (*)    AST 78 (*)    Total Bilirubin 1.3 (*)    All other components within normal limits  CBC WITH DIFFERENTIAL/PLATELET - Abnormal; Notable for the following:    Platelets 122 (*)    All other components within normal limits  URINE MICROSCOPIC-ADD ON - Abnormal; Notable for the following:    Squamous Epithelial / LPF 0-5 (*)    All other components within normal limits  RAPID STREP SCREEN (NOT AT [facility])  CULTURE, GROUP A STREP ([place])  LIPASE, BLOOD  PROTIME-INR  RPR  HIV ANTIBODY (ROUTINE TESTING)  HEMOGLOBIN A1C  SEDIMENTATION RATE    EKG  EKG Interpretation  Date/Time:  Sunday August 09 2016 08:26:46  EST Ventricular Rate:  93 PR Interval:  148 QRS Duration: 100 QT Interval:  348 QTC Calculation: 432 R Axis:   104 Text Interpretation:  Normal sinus rhythm Rightward axis Borderline ECG No significant change since last tracing Reconfirmed by Mayari Matus  MD, Zariah Cavendish ([id number]) on 08/09/2016 8:33:28 AM Also confirmed by Rogene Houston  MD, [place] [phone]), editor [place]  CCT, [place] ([id number])  on 08/09/2016 10:49:45 AM       Radiology Dg Chest 2 View  Result Date: 08/09/2016 CLINICAL DATA:  Patient with chronic shortness of breath and cough. Worsening for 5 days. EXAM: CHEST  2 VIEW COMPARISON:  Chest radiograph 09/24/2015. FINDINGS: Showing catheter projects over the right upper hemi thorax. Anterior cervical spinal fusion hardware. Stable enlarged cardiac and mediastinal contours. No consolidative pulmonary opacities. No pleural effusion or pneumothorax. Mid thoracic spine degenerative changes. IMPRESSION: No acute cardiopulmonary process. Electronically Signed   By: Lovey Newcomer M.D.   On: 08/09/2016 08:57    Procedures Procedures (including critical care time)  Medications Ordered in ED Medications  0.9 %  sodium chloride infusion ( Intravenous Hold 08/09/16 1000)  allopurinol (ZYLOPRIM) tablet 300 mg (not administered)  losartan (COZAAR) tablet 50 mg (not administered)  potassium chloride SA (K-DUR,KLOR-CON) CR tablet 40 mEq (not administered)  aspirin EC tablet 81 mg (not administered)  atorvastatin (LIPITOR) tablet 20 mg (not administered)  oxyCODONE-acetaminophen (PERCOCET) 10-325 MG per tablet 10-325 tablet (not administered)  mometasone-formoterol (DULERA) 200-5 MCG/ACT inhaler 2 puff (not administered)  guaiFENesin-dextromethorphan (ROBITUSSIN DM) 100-10 MG/5ML syrup 5 mL (not administered)  furosemide (LASIX) tablet 80 mg (not administered)  pantoprazole (PROTONIX) EC tablet 40 mg (not administered)  albuterol (PROVENTIL HFA;VENTOLIN HFA) 108 (90 Base) MCG/ACT inhaler 2 puff (not  administered)  enoxaparin (LOVENOX) injection 40 mg (not administered)  acetaminophen (TYLENOL) tablet 650 mg (not administered)    Or  acetaminophen (TYLENOL) suppository 650 mg (not administered)  ondansetron (ZOFRAN) tablet 4 mg (not administered)    Or  ondansetron (ZOFRAN) injection 4 mg (not administered)  insulin aspart (novoLOG) injection 0-9 Units (not administered)  insulin aspart (novoLOG) injection 0-5 Units (not administered)  methylPREDNISolone sodium succinate (SOLU-MEDROL) 125  mg/2 mL injection 60 mg (not administered)  diphenhydrAMINE (BENADRYL) injection 12.5 mg (not administered)  tobramycin (TOBREX) 0.3 % ophthalmic ointment (not administered)  phenol-menthol (CEPASTAT) lozenge 1 lozenge (not administered)  albuterol (PROVENTIL) (2.5 MG/3ML) 0.083% nebulizer solution 5 mg (5 mg Nebulization Given 08/09/16 0914)  sodium chloride 0.9 % bolus 500 mL (0 mLs Intravenous Stopped 08/09/16 1100)  ondansetron (ZOFRAN) injection 4 mg (4 mg Intravenous Given 08/09/16 1012)  methylPREDNISolone sodium succinate (SOLU-MEDROL) 125 mg/2 mL injection 125 mg (125 mg Intravenous Given 08/09/16 1438)  famotidine (PEPCID) IVPB 20 mg premix (20 mg Intravenous New Bag/Given 08/09/16 1439)  diphenhydrAMINE (BENADRYL) injection 25 mg (25 mg Intravenous Given 08/09/16 1436)     Initial Impression / Assessment and Plan / ED Course  I have reviewed the triage vital signs and the nursing notes.  Pertinent labs & imaging results that were available during my care of the patient were reviewed by me and considered in my medical decision making (see chart for details).  Clinical Course    Patient's illness onset on Tuesday evening very suggestive of viral illness. At that time patient had sore throat headache and dry cough. Wednesday morning had bilateral conjunctivitis. Symptoms associated with some nausea but no vomiting no diarrhea. On Friday started with a rash that rapidly spread extensively  throughout his trunk and upper extremities and now some on lower extremities between Friday and Saturday. It is not itchy. Has a bit of a burning sensation to it. Patient very uncomfortable. On Tuesday patient had the IV dye study for vascular workup. Also patient recently started on a medication for gout that he started about a week ago does not know the name of that medicine.  Patient's rash is very macular and erythematous perhaps even erythema multiform E. No peeling of the skin. Does blanch.  Patient's labs very normal vital signs very normal. The rash though is very impressive. RPR are still pending. Rapid strep was negative. Chest x-ray negative. No hypoxia and no hypotension. Tachycardia is improved with fluids.  Contacted the internal medicine for admission for observation overnight for possible erythema multiforme. Also this possible this could be an allergic reaction due to one of the medications perhaps even triggered by the viral illness. Patient treated here with Solu-Medrol Pepcid and Benadryl.  Final Clinical Impressions(s) / ED Diagnoses   Final diagnoses:  Systemic viral illness  Allergic reaction, initial encounter    New Prescriptions New Prescriptions   No medications on file     Fredia Sorrow, MD 08/09/16 1548

## 2016-08-09 NOTE — ED Notes (Signed)
Pt transported to xray 

## 2016-08-09 NOTE — ED Notes (Signed)
Pt aware of need for urine specimen but unable to obtain urine specimen at this time.

## 2016-08-09 NOTE — Progress Notes (Signed)
NURSING PROGRESS NOTE  Eric Houston [id number] Admission Data: 08/09/2016 5:30 PM Attending Provider: No att. providers found PCP:SUN, Dorethea Clan, MD Code Status: FULL  Eric Houston is a 60 y.o. male patient admitted from ED:  -No acute distress noted.  -No complaints of shortness of breath.  -No complaints of chest pain.   Cardiac Monitoring: Box # 09 in place. Cardiac monitor yields:normal sinus rhythm.  Blood pressure 127/67, pulse 87, temperature 99.7 F (37.6 C), temperature source Oral, resp. rate 17, SpO2 93 %.   IV Fluids:  IV in place, occlusive dsg intact without redness, IV cath antecubital left, condition patent and no redness normal saline.   Allergies:  Sulfa antibiotics  Past Medical History:   has a past medical history of Arthritis; Asthmatic bronchitis; COPD (chronic obstructive pulmonary disease) ([place]); Diabetes mellitus type II; Headache; Hydrocephalus; Hypertension; Shortness of breath dyspnea; and Sleep apnea.  Past Surgical History:   has a past surgical history that includes Ventriculoperitoneal shunt; Tonsillectomy; and neck fusion.  Social History:   reports that he quit smoking about 31 years ago. His smoking use included Cigarettes. He has a 4.00 pack-year smoking history. He has never used smokeless tobacco. He reports that he drinks about 2.4 oz of alcohol per week . He reports that he does not use drugs.  Skin: Rash intermittently covering back, chest, bil arms, and abdomen. Scratches from kittens on bil lower extremities.   Patient/Family orientated to room. Information packet given to patient/family. Admission inpatient armband information verified with patient/family to include name and date of birth and placed on patient arm. Side rails up x 2, fall assessment and education completed with patient/family. Patient/family able to verbalize understanding of risk associated with falls and verbalized understanding to call for assistance before getting  out of bed. Call light within reach. Patient/family able to voice and demonstrate understanding of unit orientation instructions.    Will continue to evaluate and treat per MD orders.  Charolette Child, RN

## 2016-08-10 DIAGNOSIS — H1030 Unspecified acute conjunctivitis, unspecified eye: Secondary | ICD-10-CM | POA: Diagnosis not present

## 2016-08-10 DIAGNOSIS — Z836 Family history of other diseases of the respiratory system: Secondary | ICD-10-CM

## 2016-08-10 DIAGNOSIS — H1013 Acute atopic conjunctivitis, bilateral: Secondary | ICD-10-CM | POA: Diagnosis not present

## 2016-08-10 DIAGNOSIS — E860 Dehydration: Secondary | ICD-10-CM | POA: Diagnosis not present

## 2016-08-10 DIAGNOSIS — K1370 Unspecified lesions of oral mucosa: Secondary | ICD-10-CM | POA: Diagnosis not present

## 2016-08-10 DIAGNOSIS — E119 Type 2 diabetes mellitus without complications: Secondary | ICD-10-CM | POA: Diagnosis not present

## 2016-08-10 DIAGNOSIS — J029 Acute pharyngitis, unspecified: Secondary | ICD-10-CM | POA: Diagnosis present

## 2016-08-10 DIAGNOSIS — D696 Thrombocytopenia, unspecified: Secondary | ICD-10-CM | POA: Diagnosis present

## 2016-08-10 DIAGNOSIS — R21 Rash and other nonspecific skin eruption: Secondary | ICD-10-CM | POA: Diagnosis not present

## 2016-08-10 DIAGNOSIS — I10 Essential (primary) hypertension: Secondary | ICD-10-CM | POA: Diagnosis present

## 2016-08-10 DIAGNOSIS — Z833 Family history of diabetes mellitus: Secondary | ICD-10-CM

## 2016-08-10 DIAGNOSIS — Y92009 Unspecified place in unspecified non-institutional (private) residence as the place of occurrence of the external cause: Secondary | ICD-10-CM | POA: Diagnosis not present

## 2016-08-10 DIAGNOSIS — Z6841 Body Mass Index (BMI) 40.0 and over, adult: Secondary | ICD-10-CM | POA: Diagnosis not present

## 2016-08-10 DIAGNOSIS — Z79899 Other long term (current) drug therapy: Secondary | ICD-10-CM | POA: Diagnosis not present

## 2016-08-10 DIAGNOSIS — Z882 Allergy status to sulfonamides status: Secondary | ICD-10-CM

## 2016-08-10 DIAGNOSIS — M109 Gout, unspecified: Secondary | ICD-10-CM | POA: Diagnosis not present

## 2016-08-10 DIAGNOSIS — Z7982 Long term (current) use of aspirin: Secondary | ICD-10-CM | POA: Diagnosis not present

## 2016-08-10 DIAGNOSIS — Z8249 Family history of ischemic heart disease and other diseases of the circulatory system: Secondary | ICD-10-CM

## 2016-08-10 DIAGNOSIS — L513 Stevens-Johnson syndrome-toxic epidermal necrolysis overlap syndrome: Secondary | ICD-10-CM | POA: Diagnosis present

## 2016-08-10 DIAGNOSIS — Z823 Family history of stroke: Secondary | ICD-10-CM

## 2016-08-10 DIAGNOSIS — B309 Viral conjunctivitis, unspecified: Secondary | ICD-10-CM | POA: Diagnosis not present

## 2016-08-10 DIAGNOSIS — Z87891 Personal history of nicotine dependence: Secondary | ICD-10-CM

## 2016-08-10 DIAGNOSIS — R070 Pain in throat: Secondary | ICD-10-CM | POA: Diagnosis not present

## 2016-08-10 DIAGNOSIS — I872 Venous insufficiency (chronic) (peripheral): Secondary | ICD-10-CM | POA: Diagnosis not present

## 2016-08-10 DIAGNOSIS — T7840XS Allergy, unspecified, sequela: Secondary | ICD-10-CM

## 2016-08-10 DIAGNOSIS — Z825 Family history of asthma and other chronic lower respiratory diseases: Secondary | ICD-10-CM | POA: Diagnosis not present

## 2016-08-10 DIAGNOSIS — G4733 Obstructive sleep apnea (adult) (pediatric): Secondary | ICD-10-CM | POA: Diagnosis not present

## 2016-08-10 DIAGNOSIS — H1033 Unspecified acute conjunctivitis, bilateral: Secondary | ICD-10-CM | POA: Diagnosis not present

## 2016-08-10 DIAGNOSIS — Z800 Family history of malignant neoplasm of digestive organs: Secondary | ICD-10-CM | POA: Diagnosis not present

## 2016-08-10 DIAGNOSIS — B349 Viral infection, unspecified: Secondary | ICD-10-CM | POA: Diagnosis present

## 2016-08-10 DIAGNOSIS — T504X5A Adverse effect of drugs affecting uric acid metabolism, initial encounter: Secondary | ICD-10-CM | POA: Diagnosis not present

## 2016-08-10 DIAGNOSIS — R509 Fever, unspecified: Secondary | ICD-10-CM | POA: Diagnosis not present

## 2016-08-10 DIAGNOSIS — R05 Cough: Secondary | ICD-10-CM | POA: Diagnosis not present

## 2016-08-10 DIAGNOSIS — Z981 Arthrodesis status: Secondary | ICD-10-CM | POA: Diagnosis not present

## 2016-08-10 DIAGNOSIS — Z982 Presence of cerebrospinal fluid drainage device: Secondary | ICD-10-CM | POA: Diagnosis not present

## 2016-08-10 DIAGNOSIS — J449 Chronic obstructive pulmonary disease, unspecified: Secondary | ICD-10-CM | POA: Diagnosis not present

## 2016-08-10 LAB — CBC
HEMATOCRIT: 36.9 % — AB (ref 39.0–52.0)
Hemoglobin: 12.9 g/dL — ABNORMAL LOW (ref 13.0–17.0)
MCH: 31.3 pg (ref 26.0–34.0)
MCHC: 35 g/dL (ref 30.0–36.0)
MCV: 89.6 fL (ref 78.0–100.0)
Platelets: 140 10*3/uL — ABNORMAL LOW (ref 150–400)
RBC: 4.12 MIL/uL — ABNORMAL LOW (ref 4.22–5.81)
RDW: 13.7 % (ref 11.5–15.5)
WBC: 12.8 10*3/uL — ABNORMAL HIGH (ref 4.0–10.5)

## 2016-08-10 LAB — COMPREHENSIVE METABOLIC PANEL
ALK PHOS: 54 U/L (ref 38–126)
ALT: 52 U/L (ref 17–63)
ANION GAP: 9 (ref 5–15)
AST: 70 U/L — ABNORMAL HIGH (ref 15–41)
Albumin: 3.1 g/dL — ABNORMAL LOW (ref 3.5–5.0)
BILIRUBIN TOTAL: 1.5 mg/dL — AB (ref 0.3–1.2)
BUN: 17 mg/dL (ref 6–20)
CALCIUM: 8.5 mg/dL — AB (ref 8.9–10.3)
CO2: 26 mmol/L (ref 22–32)
Chloride: 101 mmol/L (ref 101–111)
Creatinine, Ser: 0.9 mg/dL (ref 0.61–1.24)
GFR calc Af Amer: >60 mL/min (ref >60)
Glucose, Bld: 155 mg/dL — ABNORMAL HIGH (ref 65–99)
POTASSIUM: 4.2 mmol/L (ref 3.5–5.1)
Sodium: 136 mmol/L (ref 135–145)
TOTAL PROTEIN: 6.2 g/dL — AB (ref 6.5–8.1)

## 2016-08-10 LAB — RPR: RPR Ser Ql: NONREACTIVE

## 2016-08-10 LAB — RUBELLA SCREEN: Rubella: 30.9 index (ref >0.99)

## 2016-08-10 LAB — HIV ANTIBODY (ROUTINE TESTING W REFLEX): HIV SCREEN 4TH GENERATION: NONREACTIVE

## 2016-08-10 LAB — GLUCOSE, CAPILLARY
GLUCOSE-CAPILLARY: 162 mg/dL — AB (ref 65–99)
GLUCOSE-CAPILLARY: 138 mg/dL — AB (ref 65–99)
GLUCOSE-CAPILLARY: 164 mg/dL — AB (ref 65–99)
Glucose-Capillary: 145 mg/dL — ABNORMAL HIGH (ref 65–99)

## 2016-08-10 LAB — HEMOGLOBIN A1C
HEMOGLOBIN A1C: 5.7 % — AB (ref 4.8–5.6)
Mean Plasma Glucose: 117 mg/dL

## 2016-08-10 LAB — RUBELLA ANTIBODY, IGM: Rubella IgM: <20 AU/mL (ref 0.0–19.9)

## 2016-08-10 MED ORDER — DIPHENHYDRAMINE HCL 50 MG/ML IJ SOLN
12.5000 mg | Freq: Four times a day (QID) | INTRAMUSCULAR | Status: DC
Start: 2016-08-10 — End: 2016-08-11
  Administered 2016-08-10 – 2016-08-11 (×4): 12.5 mg via INTRAVENOUS
  Filled 2016-08-10 (×4): qty 1

## 2016-08-10 MED ORDER — FAMOTIDINE IN NACL 20-0.9 MG/50ML-% IV SOLN
20.0000 mg | Freq: Two times a day (BID) | INTRAVENOUS | Status: DC
  Administered 2016-08-10 – 2016-08-11 (×3): 20 mg via INTRAVENOUS
  Filled 2016-08-10 (×4): qty 50

## 2016-08-10 NOTE — Consult Note (Signed)
[facility] for Infectious Disease       Reason for Consult: fever and rash    Referring Physician: Dr. Eliseo Squires  Principal Problem:   Viral illness Active Problems:   Essential hypertension   Diabetes mellitus, type 2 (H[facility])   Sleep apnea   Hydrocephalus   Severe obesity (BMI >= 40) ([facility])   Conjunctivitis of both eyes   Rash   Sore throat   Dehydration   COPD (chronic obstructive pulmonary disease) ([facility])   Thrombocytopenia ([facility])   Bilateral lower extremity edema   . allopurinol  300 mg Oral Daily  . aspirin EC  81 mg Oral Daily  . atorvastatin  20 mg Oral Daily  . enoxaparin (LOVENOX) injection  40 mg Subcutaneous Q24H  . furosemide  80 mg Oral Daily  . insulin aspart  0-5 Units Subcutaneous QHS  . insulin aspart  0-9 Units Subcutaneous TID WC  . losartan  50 mg Oral Daily  . methylPREDNISolone (SOLU-MEDROL) injection  60 mg Intravenous Q12H  . mometasone-formoterol  2 puff Inhalation BID  . pantoprazole  40 mg Oral Daily  . penicillin v potassium  500 mg Oral BID  . potassium chloride SA  40 mEq Oral Daily    Recommendations: Continue penicillin pending swab Continue isolation for possible virus    Assessment: He has pharyngitis, rash, mild nonproductive cough, suppurative conjunctivitis.  DDx includes GAS/scarlett fever, adenovirus, Archanobacterium.  Less likely EBV, CMV with mild AST elevation.  Not c/w measles.   Acute HIV possible but denies sexual activity for 10 years.    Antibiotics: penicillin  HPI: Eric Houston is a 60 y.o. male with COPD, sleep apnea, HTN, hydrocephalus with shunt and presented with sore throat, cough, subjective fever.  Symptoms started about 5 days ago, URI symptoms particularly cough first, conjunctivitis with purulence, then rash.  No sick contacts, not around children for months, no history of same.  No sexual activity, no travel out of the area, not around refugees or people who have traveled abroad.  Did get usual vaccines  or illnesses as a child as far as he remembers.  No abdominal pain, no chest pain.  Rapid strep negative.  Feels better since admission.   Review of Systems:  Constitutional: positive for fevers and anorexia or negative for weight loss Respiratory: positive for cough, negative for wheezing or dyspnea on exertion Gastrointestinal: negative for nausea and diarrhea Musculoskeletal: negative for myalgias and arthralgias All other systems reviewed and are negative    Past Medical History:  Diagnosis Date  . Arthritis   . Asthmatic bronchitis   . COPD (chronic obstructive pulmonary disease) ([employer])   . Diabetes mellitus type II   . Headache   . Hydrocephalus    vp shunt  . Hypertension   . Shortness of breath dyspnea   . Sleep apnea     Social History  Substance Use Topics  . Smoking status: Former Smoker    Packs/day: 2.00    Years: 2.00    Types: Cigarettes    Quit date: 11/14/1984  . Smokeless tobacco: Never Used  . Alcohol use 2.4 oz/week    4 Shots of liquor per week     Comment: mixed drinks on the weekend    Family History  Problem Relation Age of Onset  . Emphysema Father     smoked  . Diabetes Father   . Heart disease Father   . Heart attack Father   . Cancer Mother  pancreatic  . Diabetes Mother   . Heart disease Mother     Born with hole in the heart  . Stroke Mother   . Diabetes Brother     Allergies  Allergen Reactions  . Sulfa Antibiotics     rash    Physical Exam: Constitutional: in no apparent distress  Vitals:   08/10/16 0042 08/10/16 0503  BP: 113/62 106/62  Pulse: 68 69  Resp: 20 17  Temp: 98.7 F (37.1 C) 98.1 F (36.7 C)   EYES: anicteric ENMT: no strawberry tongue or swelling, no cervical lad; + erythema in throat, some white patches Cardiovascular: Cor RRR; non-pitting leg edema Respiratory: CTA B; normal respiratory effort GI: Bowel sounds are normal, liver is not enlarged, spleen is not enlarged Musculoskeletal: edema, no  pitting Skin: diffuse mainly confluent, blanching erythematous rash palpable Hematologic: no cervical, no supraclavicular lad  Lab Results  Component Value Date   WBC 12.8 (H) 08/10/2016   HGB 12.9 (L) 08/10/2016   HCT 36.9 (L) 08/10/2016   MCV 89.6 08/10/2016   PLT 140 (L) 08/10/2016    Lab Results  Component Value Date   CREATININE 0.90 08/10/2016   BUN 17 08/10/2016   NA 136 08/10/2016   K 4.2 08/10/2016   CL 101 08/10/2016   CO2 26 08/10/2016    Lab Results  Component Value Date   ALT 52 08/10/2016   AST 70 (H) 08/10/2016   ALKPHOS 54 08/10/2016     Microbiology: Recent Results (from the past 240 hour(s))  Rapid strep screen (not at [facility])     Status: None   Collection Time: 08/09/16 10:10 AM  Result Value Ref Range Status   Streptococcus, Group A Screen (Direct) NEGATIVE NEGATIVE Final    Comment: (NOTE) A Rapid Antigen test may result negative if the antigen level in the sample is below the detection level of this test. The FDA has not cleared this test as a stand-alone test therefore the rapid antigen negative result has reflexed to a Group A Strep culture.     Scharlene Gloss, [facility] for Infectious Disease [place] [email or web address] [phone] pager  [phone] cell 08/10/2016, 9:43 AM

## 2016-08-10 NOTE — Care Management Note (Signed)
Case Management Note  Patient Details  Name: NUTE HEALAN MRN: [id number] Date of Birth: 13-May-1956  Subjective/Objective:    Fever, viral infection, rash                Action/Plan: Discharge Planning:   NCM spoke to pt and lives with roommates. States he is independent prior to hospital admission. He can afford his medications. No NCM needs identified.   PCP SUN, ENID Y  Expected Discharge Date:               Expected Discharge Plan:  Home/Self Care  In-House Referral:  NA  Discharge planning Services  CM Consult  Post Acute Care Choice:  NA Choice offered to:  NA  DME Arranged:  N/A DME Agency:  NA  HH Arranged:  NA HH Agency:  NA  Status of Service:  Completed, signed off  If discussed at [place] of Stay Meetings, dates discussed:    Additional Comments:  Erenest Rasher, RN 08/10/2016, 11:25 AM

## 2016-08-10 NOTE — Progress Notes (Signed)
PROGRESS NOTE    Eric Houston  [id number] DOB: [birth year] DOA: 08/09/2016 PCP: Alphonzo Lemmings, MD   Outpatient Specialists:     Brief Narrative:  Eric Houston is a 60 y.o. male with a Past Medical History with past medical history significant for diabetes, sleep apnea, hypertension and COPD who presents with pharyngitis, cough, eye redness. Patient's presentation is concerning for the possibility of measles or scarlet fever. Case discussed with infectious disease Dr. Megan Salon. Patient be started on antibiotics. Also checking IgM IgG for rubella. Patient is on droplet precautions. Infectious disease will see the patient in the morning. Patient dehydrated on exam general hydration overnight.   Assessment & Plan:   Principal Problem:   Viral illness Active Problems:   Essential hypertension   Diabetes mellitus, type 2 (HCC)   Sleep apnea   Hydrocephalus   Severe obesity (BMI >= 40) (HCC)   Conjunctivitis of both eyes   Rash   Sore throat   Dehydration   COPD (chronic obstructive pulmonary disease) (HCC)   Thrombocytopenia (HCC)   Bilateral lower extremity edema   Viral illness/sore throat vs allergic reaction to contrast? ? upper respiratory viral illness. Rapid strep negative. Chest xray no acute cardiopulmonary process. Labs unremarkable. -admit to medsurg -IV fluids -supportive therapy -appreciate ID consult -steroids, pepcid benadryl  Rash. Etiology unclear.  -Discussed with ID who recommended rubella screen and rubella igm and antibiotic coverage -PCR measles -continue solumedrol -rubella screen -rubella igm -continue benadryl and pepcid -ID    Diabetes. On oral agents. Serum glucose 121 on admission -Hold oral agents for now -Sliding scale for optimal control -Obtain a hemoglobin A1c  Hypertension. Fair control in the emergency department. Home medications include Lasix, Cozaar -Hold Lasix for now -We'll resume home Cozaar -Monitor   Sleep  apnea. Wears C Pap at home -Respiratory consult for C Pap  History of hydrocephalus status post shunt. Stable at baseline  COPD. Stable. Not on oxygen at home. Does use home inhalers. Chest x-ray as noted above. Oxygen saturation level greater than 90% on room air -Continue home meds -Monitor oxygen saturation level -supplemental oxygen as needed  Thrombocytopenia. Platelet level 122.no s/sx bleeding. Likely related to #1.  -monitor  Bilateral lower extremity edema. Chronic. Pierce stable at baseline. Medications include Lasix. Chart review indicates evaluated by vascular last month. Office note indicates they opined leg swelling likely multifactorial including a component of chronic venous insufficiency. Echo ordered. Of note test is "active" in epic. No results     DVT prophylaxis:  Lovenox   Code Status: Full Code   Family Communication:   Disposition Plan:     Consultants:   ID   Subjective: Rash present-- not itchy-- started after dye for cardiac procedure at [place]  Objective: Vitals:   08/10/16 0042 08/10/16 0253 08/10/16 0503 08/10/16 0853  BP: 113/62  106/62   Pulse: 68  69   Resp: 20  17   Temp: 98.7 F (37.1 C)  98.1 F (36.7 C)   TempSrc: Oral  Oral   SpO2: 96%  94% 97%  Weight:  131 kg (288 lb 11.4 oz)    Height:        Intake/Output Summary (Last 24 hours) at 08/10/16 1006 Last data filed at 08/10/16 0818  Gross per 24 hour  Intake             1495 ml  Output  900 ml  Net              595 ml   Filed Weights   08/09/16 1732 08/10/16 0253  Weight: 132 kg (291 lb 1.6 oz) 131 kg (288 lb 11.4 oz)    Examination:  General exam: Appears calm and comfortable  Respiratory system: Clear to auscultation. Respiratory effort normal. Cardiovascular system: S1 & S2 heard, RRR. No JVD, murmurs, rubs, gallops or clicks. No pedal edema. Gastrointestinal system: Abdomen is nondistended, soft and nontender. No organomegaly  or masses felt. Normal bowel sounds heard. .     Data Reviewed: I have personally reviewed following labs and imaging studies  CBC:  Recent Labs Lab 08/09/16 1007 08/10/16 0632  WBC 7.9 12.8*  NEUTROABS 5.3  --   HGB 13.8 12.9*  HCT 39.5 36.9*  MCV 89.6 89.6  PLT 122* [id number]*   Basic Metabolic Panel:  Recent Labs Lab 08/09/16 1007 08/10/16 0632  NA 136 136  K 3.5 4.2  CL 98* 101  CO2 25 26  GLUCOSE 121* 155*  BUN 13 17  CREATININE 1.02 0.90  CALCIUM 8.7* 8.5*   GFR: Estimated Creatinine Clearance (by C-G formula based on SCr of 0.9 mg/dL) Male: 90.9 mL/min Male: 110.2 mL/min Liver Function Tests:  Recent Labs Lab 08/09/16 1007 08/10/16 0632  AST 78* 70*  ALT 55 52  ALKPHOS 63 54  BILITOT 1.3* 1.5*  PROT 6.6 6.2*  ALBUMIN 3.5 3.1*    Recent Labs Lab 08/09/16 1007  LIPASE 27   No results for input(s): AMMONIA in the last 168 hours. Coagulation Profile:  Recent Labs Lab 08/09/16 1007  INR 1.03   Cardiac Enzymes: No results for input(s): CKTOTAL, CKMB, CKMBINDEX, TROPONINI in the last 168 hours. BNP (last 3 results) No results for input(s): PROBNP in the last 8760 hours. HbA1C: No results for input(s): HGBA1C in the last 72 hours. CBG:  Recent Labs Lab 08/09/16 1709 08/09/16 2141 08/10/16 0746  GLUCAP 139* 184* 162*   Lipid Profile: No results for input(s): CHOL, HDL, LDLCALC, TRIG, CHOLHDL, LDLDIRECT in the last 72 hours. Thyroid Function Tests: No results for input(s): TSH, T4TOTAL, FREET4, T3FREE, THYROIDAB in the last 72 hours. Anemia Panel: No results for input(s): VITAMINB12, FOLATE, FERRITIN, TIBC, IRON, RETICCTPCT in the last 72 hours. Urine analysis:    Component Value Date/Time   COLORURINE ORANGE (A) 08/09/2016 1255   APPEARANCEUR CLEAR 08/09/2016 1255   LABSPEC 1.029 08/09/2016 1255   PHURINE 6.0 08/09/2016 1255   GLUCOSEU NEGATIVE 08/09/2016 1255   HGBUR SMALL (A) 08/09/2016 1255   BILIRUBINUR SMALL (A) 08/09/2016  1255   KETONESUR NEGATIVE 08/09/2016 1255   PROTEINUR 100 (A) 08/09/2016 1255   NITRITE NEGATIVE 08/09/2016 1255   LEUKOCYTESUR NEGATIVE 08/09/2016 1255     ) Recent Results (from the past 240 hour(s))  Rapid strep screen (not at [facility])     Status: None   Collection Time: 08/09/16 10:10 AM  Result Value Ref Range Status   Streptococcus, Group A Screen (Direct) NEGATIVE NEGATIVE Final    Comment: (NOTE) A Rapid Antigen test may result negative if the antigen level in the sample is below the detection level of this test. The FDA has not cleared this test as a stand-alone test therefore the rapid antigen negative result has reflexed to a Group A Strep culture.       Anti-infectives    Start     Dose/Rate Route Frequency Ordered Stop   08/09/16 1700  penicillin v potassium (VEETID) 250 MG/5ML solution 500 mg     500 mg Oral 2 times daily 08/09/16 1648 08/19/16 2159       Radiology Studies: Dg Chest 2 View  Result Date: 08/09/2016 CLINICAL DATA:  Patient with chronic shortness of breath and cough. Worsening for 5 days. EXAM: CHEST  2 VIEW COMPARISON:  Chest radiograph 09/24/2015. FINDINGS: Showing catheter projects over the right upper hemi thorax. Anterior cervical spinal fusion hardware. Stable enlarged cardiac and mediastinal contours. No consolidative pulmonary opacities. No pleural effusion or pneumothorax. Mid thoracic spine degenerative changes. IMPRESSION: No acute cardiopulmonary process. Electronically Signed   By: Lovey Newcomer M.D.   On: 08/09/2016 08:57        Scheduled Meds: . allopurinol  300 mg Oral Daily  . aspirin EC  81 mg Oral Daily  . atorvastatin  20 mg Oral Daily  . diphenhydrAMINE  12.5 mg Intravenous Q6H  . enoxaparin (LOVENOX) injection  40 mg Subcutaneous Q24H  . famotidine (PEPCID) IV  20 mg Intravenous Q12H  . furosemide  80 mg Oral Daily  . insulin aspart  0-5 Units Subcutaneous QHS  . insulin aspart  0-9 Units Subcutaneous TID WC  . losartan   50 mg Oral Daily  . methylPREDNISolone (SOLU-MEDROL) injection  60 mg Intravenous Q12H  . mometasone-formoterol  2 puff Inhalation BID  . pantoprazole  40 mg Oral Daily  . penicillin v potassium  500 mg Oral BID  . potassium chloride SA  40 mEq Oral Daily   Continuous Infusions: . sodium chloride 75 mL/hr at 08/10/16 0823     LOS: 0 days    Time spent: 25 min    [place], DO [facility] Pager [phone]  If 7PM-7AM, please contact night-coverage [email or web address] Password [email or web address] 08/10/2016, 10:06 AM

## 2016-08-11 DIAGNOSIS — I10 Essential (primary) hypertension: Secondary | ICD-10-CM

## 2016-08-11 DIAGNOSIS — Z79899 Other long term (current) drug therapy: Secondary | ICD-10-CM

## 2016-08-11 DIAGNOSIS — B309 Viral conjunctivitis, unspecified: Secondary | ICD-10-CM

## 2016-08-11 DIAGNOSIS — D696 Thrombocytopenia, unspecified: Secondary | ICD-10-CM

## 2016-08-11 DIAGNOSIS — G4730 Sleep apnea, unspecified: Secondary | ICD-10-CM

## 2016-08-11 LAB — SEDIMENTATION RATE: SED RATE: 45 mm/h — AB (ref 0–16)

## 2016-08-11 LAB — CULTURE, GROUP A STREP (THRC)

## 2016-08-11 LAB — CBC
HCT: 38.9 % — ABNORMAL LOW (ref 39.0–52.0)
Hemoglobin: 13.7 g/dL (ref 13.0–17.0)
MCH: 31.6 pg (ref 26.0–34.0)
MCHC: 35.2 g/dL (ref 30.0–36.0)
MCV: 89.6 fL (ref 78.0–100.0)
PLATELETS: 178 10*3/uL (ref 150–400)
RBC: 4.34 MIL/uL (ref 4.22–5.81)
RDW: 13.8 % (ref 11.5–15.5)
WBC: 14.1 10*3/uL — ABNORMAL HIGH (ref 4.0–10.5)

## 2016-08-11 LAB — HEPATIC FUNCTION PANEL
ALT: 85 U/L — ABNORMAL HIGH (ref 17–63)
AST: 71 U/L — ABNORMAL HIGH (ref 15–41)
Albumin: 3.4 g/dL — ABNORMAL LOW (ref 3.5–5.0)
Alkaline Phosphatase: 61 U/L (ref 38–126)
BILIRUBIN DIRECT: 0.2 mg/dL (ref 0.1–0.5)
BILIRUBIN INDIRECT: 0.5 mg/dL (ref 0.3–0.9)
TOTAL PROTEIN: 7.3 g/dL (ref 6.5–8.1)
Total Bilirubin: 0.7 mg/dL (ref 0.3–1.2)

## 2016-08-11 LAB — BASIC METABOLIC PANEL
Anion gap: 11 (ref 5–15)
BUN: 25 mg/dL — ABNORMAL HIGH (ref 6–20)
CALCIUM: 8.7 mg/dL — AB (ref 8.9–10.3)
CO2: 22 mmol/L (ref 22–32)
CREATININE: 1.11 mg/dL (ref 0.61–1.24)
Chloride: 105 mmol/L (ref 101–111)
GFR calc non Af Amer: >60 mL/min (ref >60)
Glucose, Bld: 139 mg/dL — ABNORMAL HIGH (ref 65–99)
Potassium: 4 mmol/L (ref 3.5–5.1)
SODIUM: 138 mmol/L (ref 135–145)

## 2016-08-11 LAB — GLUCOSE, CAPILLARY
GLUCOSE-CAPILLARY: 149 mg/dL — AB (ref 65–99)
GLUCOSE-CAPILLARY: 147 mg/dL — AB (ref 65–99)
GLUCOSE-CAPILLARY: 126 mg/dL — AB (ref 65–99)
GLUCOSE-CAPILLARY: 125 mg/dL — AB (ref 65–99)

## 2016-08-11 LAB — C-REACTIVE PROTEIN: CRP: 11.5 mg/dL — AB (ref <1.0)

## 2016-08-11 MED ORDER — LIDOCAINE HCL (PF) 2 % IJ SOLN
0.0000 mL | Freq: Once | INTRAMUSCULAR | Status: DC | PRN
  Filled 2016-08-11: qty 20

## 2016-08-11 MED ORDER — PHENOL 1.4 % MT LIQD
1.0000 | OROMUCOSAL | Status: DC | PRN
  Administered 2016-08-11: 1 via OROMUCOSAL
  Filled 2016-08-11: qty 177

## 2016-08-11 MED ORDER — BLISTEX MEDICATED EX OINT
TOPICAL_OINTMENT | CUTANEOUS | Status: DC | PRN
  Administered 2016-08-11: 11:00:00 via TOPICAL
  Filled 2016-08-11: qty 6.3

## 2016-08-11 MED ORDER — ZOLPIDEM TARTRATE 5 MG PO TABS
5.0000 mg | ORAL_TABLET | Freq: Every evening | ORAL | Status: DC | PRN
  Administered 2016-08-11 – 2016-08-12 (×2): 5 mg via ORAL
  Filled 2016-08-11 (×2): qty 1

## 2016-08-11 MED ORDER — LIDOCAINE HCL (CARDIAC) 20 MG/ML IV SOLN
INTRAVENOUS | Status: AC
  Filled 2016-08-11: qty 5

## 2016-08-11 NOTE — Progress Notes (Addendum)
Received patient report at 1130am without tele monitor. Patient c/o discomfort and irritation of the skin with the tele box. Dr Eliseo Squires made aware via text page

## 2016-08-11 NOTE — Progress Notes (Signed)
PROGRESS NOTE    Eric Houston  [id number] DOB: 09-Jul-1956 DOA: 08/09/2016 PCP: Alphonzo Lemmings, MD   Outpatient Specialists:     Brief Narrative:  Eric Houston is a 60 y.o. male with a Past Medical History with past medical history significant for diabetes, sleep apnea, hypertension and COPD who presents with pharyngitis, cough, eye redness. Patient's presentation is concerning for the possibility of measles or scarlet fever. Case discussed with infectious disease Dr. Megan Salon. Patient be started on antibiotics. Also checking IgM IgG for rubella. Patient is on droplet precautions. Infectious disease will see the patient in the morning. Patient dehydrated on exam general hydration overnight.   Assessment & Plan:   Principal Problem:   Viral illness Active Problems:   Essential hypertension   Diabetes mellitus, type 2 (HCC)   Sleep apnea   Hydrocephalus   Severe obesity (BMI >= 40) (HCC)   Conjunctivitis of both eyes   Rash   Sore throat   Dehydration   COPD (chronic obstructive pulmonary disease) (HCC)   Thrombocytopenia (HCC)   Bilateral lower extremity edema   Viral illness/sore throat-- does not appear to be a rxn to contrast ? upper respiratory viral illness. Rapid strep negative- await culture - Chest xray no acute cardiopulmonary process. Labs unremarkable. -IV fluids -supportive therapy -appreciate ID consult  Rash. Etiology unclear.  -Discussed with ID  -rubella screen and rubella igm- negative   Diabetes. On oral agents. Serum glucose 121 on admission -Hold oral agents for now -Sliding scale for optimal control -Obtain a hemoglobin A1c  Hypertension. Fair control in the emergency department. Home medications include Lasix, Cozaar -Hold Lasix for now -We'll resume home Cozaar -Monitor   Sleep apnea. Wears C Pap at home -Respiratory consult for C Pap  History of hydrocephalus status post shunt. Stable at baseline  COPD. Stable. Not on  oxygen at home. Does use home inhalers. Chest x-ray as noted above. Oxygen saturation level greater than 90% on room air -Continue home meds -Monitor oxygen saturation level -supplemental oxygen as needed  Thrombocytopenia.  no s/sx bleeding. Likely related to illness -monitor  Bilateral lower extremity edema. Chronic.  - Chart review indicates evaluated by vascular last month. Office note indicates they opined leg swelling likely multifactorial including a component of chronic venous insufficiency.      DVT prophylaxis:  Lovenox   Code Status: Full Code   Family Communication:   Disposition Plan:     Consultants:   ID   Subjective: Worsening pain with swallowing   Objective: Vitals:   08/10/16 1904 08/10/16 1907 08/10/16 2140 08/11/16 0527  BP: 106/60 106/60 (!) 113/56 (!) 122/59  Pulse: 64  72 72  Resp:   18 17  Temp: 98.6 F (37 C) 98.6 F (37 C) 98.1 F (36.7 C) 99.3 F (37.4 C)  TempSrc: Oral Oral Oral Oral  SpO2:   96% 96%  Weight:    131.5 kg (289 lb 14.5 oz)  Height:        Intake/Output Summary (Last 24 hours) at 08/11/16 1033 Last data filed at 08/11/16 0915  Gross per 24 hour  Intake          1553.75 ml  Output              700 ml  Net           853.75 ml   Filed Weights   08/09/16 1732 08/10/16 0253 08/11/16 0527  Weight: 132 kg (291 lb 1.6 oz)  131 kg (288 lb 11.4 oz) 131.5 kg (289 lb 14.5 oz)    Examination:  General exam: Appears calm and comfortable  Respiratory system: Clear to auscultation. Respiratory effort normal. Cardiovascular system: S1 & S2 heard, RRR. No JVD, murmurs, rubs, gallops or clicks. No pedal edema. Gastrointestinal system: Abdomen is nondistended, soft and nontender. No organomegaly or masses felt. Normal bowel sounds heard. Rash- blanching-- mainly upper extremities and torso .     Data Reviewed: I have personally reviewed following labs and imaging studies  CBC:  Recent Labs Lab 08/09/16 1007  08/10/16 0632 08/11/16 0441  WBC 7.9 12.8* 14.1*  NEUTROABS 5.3  --   --   HGB 13.8 12.9* 13.7  HCT 39.5 36.9* 38.9*  MCV 89.6 89.6 89.6  PLT 122* 140* 0000000   Basic Metabolic Panel:  Recent Labs Lab 08/09/16 1007 08/10/16 0632 08/11/16 0441  NA 136 136 138  K 3.5 4.2 4.0  CL 98* 101 105  CO2 25 26 22   GLUCOSE 121* 155* 139*  BUN 13 17 25*  CREATININE 1.02 0.90 1.11  CALCIUM 8.7* 8.5* 8.7*   GFR: Estimated Creatinine Clearance (by C-G formula based on SCr of 1.11 mg/dL) Male: 73.9 mL/min Male: 89.6 mL/min Liver Function Tests:  Recent Labs Lab 08/09/16 1007 08/10/16 0632  AST 78* 70*  ALT 55 52  ALKPHOS 63 54  BILITOT 1.3* 1.5*  PROT 6.6 6.2*  ALBUMIN 3.5 3.1*    Recent Labs Lab 08/09/16 1007  LIPASE 27   No results for input(s): AMMONIA in the last 168 hours. Coagulation Profile:  Recent Labs Lab 08/09/16 1007  INR 1.03   Cardiac Enzymes: No results for input(s): CKTOTAL, CKMB, CKMBINDEX, TROPONINI in the last 168 hours. BNP (last 3 results) No results for input(s): PROBNP in the last 8760 hours. HbA1C:  Recent Labs  08/09/16 1702  HGBA1C 5.7*   CBG:  Recent Labs Lab 08/10/16 0746 08/10/16 1134 08/10/16 1731 08/10/16 2138 08/11/16 0815  GLUCAP 162* 145* 138* 164* 149*   Lipid Profile: No results for input(s): CHOL, HDL, LDLCALC, TRIG, CHOLHDL, LDLDIRECT in the last 72 hours. Thyroid Function Tests: No results for input(s): TSH, T4TOTAL, FREET4, T3FREE, THYROIDAB in the last 72 hours. Anemia Panel: No results for input(s): VITAMINB12, FOLATE, FERRITIN, TIBC, IRON, RETICCTPCT in the last 72 hours. Urine analysis:    Component Value Date/Time   COLORURINE ORANGE (A) 08/09/2016 1255   APPEARANCEUR CLEAR 08/09/2016 1255   LABSPEC 1.029 08/09/2016 1255   PHURINE 6.0 08/09/2016 1255   GLUCOSEU NEGATIVE 08/09/2016 1255   HGBUR SMALL (A) 08/09/2016 1255   BILIRUBINUR SMALL (A) 08/09/2016 1255   KETONESUR NEGATIVE 08/09/2016 1255     PROTEINUR 100 (A) 08/09/2016 1255   NITRITE NEGATIVE 08/09/2016 1255   LEUKOCYTESUR NEGATIVE 08/09/2016 1255     ) Recent Results (from the past 240 hour(s))  Rapid strep screen (not at [facility])     Status: None   Collection Time: 08/09/16 10:10 AM  Result Value Ref Range Status   Streptococcus, Group A Screen (Direct) NEGATIVE NEGATIVE Final    Comment: (NOTE) A Rapid Antigen test may result negative if the antigen level in the sample is below the detection level of this test. The FDA has not cleared this test as a stand-alone test therefore the rapid antigen negative result has reflexed to a Group A Strep culture.   Culture, group A strep     Status: None (Preliminary result)   Collection Time: 08/09/16 10:10 AM  Result Value Ref Range Status   Specimen Description THROAT  Final   Special Requests NONE Reflexed from [id number]  Final   Culture CULTURE REINCUBATED FOR BETTER GROWTH  Final   Report Status PENDING  Incomplete      Anti-infectives    Start     Dose/Rate Route Frequency Ordered Stop   08/09/16 1700  penicillin v potassium (VEETID) 250 MG/5ML solution 500 mg     500 mg Oral 2 times daily 08/09/16 1648 08/19/16 2159       Radiology Studies: No results found.      Scheduled Meds: . allopurinol  300 mg Oral Daily  . aspirin EC  81 mg Oral Daily  . atorvastatin  20 mg Oral Daily  . enoxaparin (LOVENOX) injection  40 mg Subcutaneous Q24H  . furosemide  80 mg Oral Daily  . insulin aspart  0-5 Units Subcutaneous QHS  . insulin aspart  0-9 Units Subcutaneous TID WC  . losartan  50 mg Oral Daily  . mometasone-formoterol  2 puff Inhalation BID  . pantoprazole  40 mg Oral Daily  . penicillin v potassium  500 mg Oral BID  . potassium chloride SA  40 mEq Oral Daily   Continuous Infusions: . sodium chloride 75 mL/hr at 08/10/16 2313     LOS: 1 day    Time spent: 25 min    [place], DO [facility] Pager [phone]  If 7PM-7AM, please  contact night-coverage [email or web address] Password [email or web address] 08/11/2016, 10:33 AM

## 2016-08-11 NOTE — Progress Notes (Addendum)
    [facility] for Infectious Disease   Reason for visit: Follow up on rash  Interval History: negative for GAS throat swab, rash worsening.  Some sloughing of skin.  Remains afebrile.    Physical Exam: Constitutional:  Vitals:   08/11/16 0527 08/11/16 1000  BP: (!) 122/59 121/64  Pulse: 72 83  Resp: 17 16  Temp: 99.3 F (37.4 C) 98.1 F (36.7 C)   patient appears in mild distress with rash Eyes: anicteric HENT: no thrush, mucous membranes with some dryness, lesions on inner lip Respiratory: Normal respiratory effort; CTA B Cardiovascular: RRR Skin: diffuse, confluent blanching erythema on trunk, upper extermities, scalp with sloughing on scalp, over chest where leads were.   Review of Systems: Constitutional: positive for fatigue, malaise and anorexia or negative for fevers and chills Respiratory: negative for cough Gastrointestinal: negative for diarrhea  Lab Results  Component Value Date   WBC 14.1 (H) 08/11/2016   HGB 13.7 08/11/2016   HCT 38.9 (L) 08/11/2016   MCV 89.6 08/11/2016   PLT 178 08/11/2016    Lab Results  Component Value Date   CREATININE 1.11 08/11/2016   BUN 25 (H) 08/11/2016   NA 138 08/11/2016   K 4.0 08/11/2016   CL 105 08/11/2016   CO2 22 08/11/2016    Lab Results  Component Value Date   ALT 52 08/10/2016   AST 70 (H) 08/10/2016   ALKPHOS 54 08/10/2016     Microbiology: Recent Results (from the past 240 hour(s))  Rapid strep screen (not at [facility])     Status: None   Collection Time: 08/09/16 10:10 AM  Result Value Ref Range Status   Streptococcus, Group A Screen (Direct) NEGATIVE NEGATIVE Final    Comment: (NOTE) A Rapid Antigen test may result negative if the antigen level in the sample is below the detection level of this test. The FDA has not cleared this test as a stand-alone test therefore the rapid antigen negative result has reflexed to a Group A Strep culture.   Culture, group A strep     Status: None   Collection  Time: 08/09/16 10:10 AM  Result Value Ref Range Status   Specimen Description THROAT  Final   Special Requests NONE Reflexed from [id number]  Final   Culture NO GROUP A STREP (S.PYOGENES) ISOLATED  Final   Report Status 08/11/2016 FINAL  Final    Impression/Plan:  1. Diffuse rash - DDx remains broad.  GAS negative so will stop penicillin.  Still could be archaneobacterium.  Rubella negative for acute disease.  With penicillin could be EBV or CMV that got worse with antibiotic.  Viral cause such as adenovirus, cmv, ebv; SJS/TEN possible but typically with continued fever but he does have mucous membrane involvement, conjunctivitis; adult Kawasaki's disease still possible as atypical (also though typicaly with high fever that is persistent); IgA bullous dermatosis, allergic reaction from recent dye exposure.  Scalded skin syndrome; acute HIV  I have asked surgery for a punch biopsy - He is now developing bullous lesions with one on his left arm - would be a good site for one biopsy sample.    I am continuing isolation with concern for viral etiology.    I will check some additional labs, echo.   2. Thrombocytopenia - this is improving, likely secondary to acute illness.    3. HTN - is on his bp meds.  No signs of shock

## 2016-08-11 NOTE — Consult Note (Signed)
[facility] Consult Note  Eric Houston 06/11/1956  [id number].    Requesting MD: Comer Chief Complaint/Reason for Consult: Rash  HPI:  Eric Houston is a 60yo male admitted to [facility] on 08/09/16 with pharyngitis, cough, and eye redness that began 5 days prior to admission. He also reports a rash that developed on his trunk, BUE's, and face 1 day prior to admission. Patient is unsure what may have caused this. The only thing that has changed was the morning before his symptoms developed patient reports undergoing some test where they injected something into his veins and performed an imaging study looking at the blood flow in his extremities. States that nothing has made his symptoms better or worse. He denies abdominal pain, nausea, or vomiting. He does feel tired and has a suppressed appetite.   PMH significant for DM, obesity, HTN, COPD, OSA on CPAP, hydrocephalus status post shunt  ROS: All systems reviewed and otherwise negative except for as above  Family History  Problem Relation Age of Onset  . Emphysema Father     smoked  . Diabetes Father   . Heart disease Father   . Heart attack Father   . Cancer Mother     pancreatic  . Diabetes Mother   . Heart disease Mother     Born with hole in the heart  . Stroke Mother   . Diabetes Brother     Past Medical History:  Diagnosis Date  . Arthritis   . Asthmatic bronchitis   . COPD (chronic obstructive pulmonary disease) (HCC)   . Diabetes mellitus type II   . Headache   . Hydrocephalus    vp shunt  . Hypertension   . Shortness of breath dyspnea   . Sleep apnea     Past Surgical History:  Procedure Laterality Date  . neck fusion    . TONSILLECTOMY    . VENTRICULOPERITONEAL SHUNT     x 2    Social History:  reports that he quit smoking about 31 years ago. His smoking use included Cigarettes. He has a 4.00 pack-year smoking history. He has never used smokeless tobacco. He reports that he drinks about  2.4 oz of alcohol per week . He reports that he does not use drugs.  Allergies:  Allergies  Allergen Reactions  . Sulfa Antibiotics     rash    Medications Prior to Admission  Medication Sig Dispense Refill  . albuterol (VENTOLIN HFA) 108 (90 BASE) MCG/ACT inhaler Inhale 2 puffs into the lungs every 4 (four) hours as needed.     . allopurinol (ZYLOPRIM) 300 MG tablet Take 300 mg by mouth daily.    . aspirin EC 81 MG tablet Take 1 tablet (81 mg total) by mouth daily.    . atorvastatin (LIPITOR) 20 MG tablet Take 1 tablet (20 mg total) by mouth daily. 90 tablet 3  . budesonide-formoterol (SYMBICORT) 160-4.5 MCG/ACT inhaler Inhale 2 puffs into the lungs 2 (two) times daily. 1 Inhaler 3  . furosemide (LASIX) 80 MG tablet Take 80 mg by mouth daily.     . guaiFENesin-dextromethorphan (ROBITUSSIN DM) 100-10 MG/5ML syrup Take 5 mLs by mouth every 4 (four) hours as needed for cough. 118 mL 0  . losartan (COZAAR) 50 MG tablet Take 50 mg by mouth daily.    . metFORMIN (GLUCOPHAGE) 500 MG tablet Take 500 mg by mouth 2 (two) times daily.    . omeprazole (PRILOSEC) 20 MG capsule Take 20 mg by   mouth daily.    . oxyCODONE-acetaminophen (PERCOCET) 10-325 MG tablet Take 10-325 tablets by mouth every 8 (eight) hours as needed for pain.    . pioglitazone (ACTOS) 45 MG tablet Take 45 mg by mouth daily.    . potassium chloride SA (K-DUR,KLOR-CON) 20 MEQ tablet Take 2 tablets (40 mEq total) by mouth daily.      Blood pressure (!) 147/84, pulse 78, temperature 98.3 F (36.8 C), temperature source Oral, resp. rate 18, height 5' 5" (1.651 m), weight 289 lb 14.5 oz (131.5 kg), SpO2 97 %. Physical Exam: General: pleasant, ill-appearing, WD/WN white male who is laying in bed in NAD HEENT: head is normocephalic, atraumatic.  Sclera are noninjected.  Ears and nose without any masses or lesions. Erythema present in his throat. Mouth is dry Heart: regular, rate, and rhythm.  No obvious murmurs, gallops, or rubs noted.   Palpable pedal pulses bilaterally Lungs: CTAB, no wheezes, rhonchi, or rales noted.  Respiratory effort nonlabored Abd: soft, NT/ND, +BS, no masses, hernias, or organomegaly MS: all 4 extremities are symmetrical with no cyanosis, clubbing, or edema. Skin: diffuse blanching erythematous rash with few blisters that extends from his face into his neck, abdomen, back, and BUE's Psych: A&Ox3 with an appropriate affect. Neuro: CM 2-12 intact, extremity CSM intact bilaterally, normal speech  Results for orders placed or performed during the hospital encounter of 08/09/16 (from the past 48 hour(s))  Influenza panel by PCR (type A & B, H1N1)     Status: None   Collection Time: 08/09/16  4:13 PM  Result Value Ref Range   Influenza A By PCR NEGATIVE NEGATIVE   Influenza B By PCR NEGATIVE NEGATIVE    Comment: (NOTE) The Xpert Xpress Flu assay is intended as an aid in the diagnosis of  influenza and should not be used as a sole basis for treatment.  This  assay is FDA approved for nasopharyngeal swab specimens only. Nasal  washings and aspirates are unacceptable for Xpert Xpress Flu testing.   Hemoglobin A1c     Status: Abnormal   Collection Time: 08/09/16  5:02 PM  Result Value Ref Range   Hgb A1c MFr Bld 5.7 (H) 4.8 - 5.6 %    Comment: (NOTE)         Pre-diabetes: 5.7 - 6.4         Diabetes: >6.4         Glycemic control for adults with diabetes: <7.0    Mean Plasma Glucose 117 mg/dL    Comment: (NOTE) Performed At: [facility] [address] [place], [place] [id number] Hancock William F MD Ph:[phone]   Rubella screen     Status: None   Collection Time: 08/09/16  5:02 PM  Result Value Ref Range   Rubella 30.90 Immune >0.99 index    Comment: (NOTE)                                Non-immune       <0.90                                Equivocal  0.90 - 0.99                                Immune           >  0.99 Performed At: [facility] [place] [address] [place], [place]  [id number] Hancock William F MD Ph:[phone]   Rubella antibody, IgM     Status: None   Collection Time: 08/09/16  5:02 PM  Result Value Ref Range   Rubella IgM <20.0 0.0 - 19.9 AU/mL    Comment: (NOTE)                             Negative            <20.0                             Equivocal     20.0 - 24.9                             Positive            >24.9 Performed At: [facility] [address] [place], [place] [id number] Hancock William F MD Ph:[phone]   Glucose, capillary     Status: Abnormal   Collection Time: 08/09/16  5:09 PM  Result Value Ref Range   Glucose-Capillary 139 (H) 65 - 99 mg/dL  Glucose, capillary     Status: Abnormal   Collection Time: 08/09/16  9:41 PM  Result Value Ref Range   Glucose-Capillary 184 (H) 65 - 99 mg/dL  CBC     Status: Abnormal   Collection Time: 08/10/16  6:32 AM  Result Value Ref Range   WBC 12.8 (H) 4.0 - 10.5 K/uL   RBC 4.12 (L) 4.22 - 5.81 MIL/uL   Hemoglobin 12.9 (L) 13.0 - 17.0 g/dL   HCT 36.9 (L) 39.0 - 52.0 %   MCV 89.6 78.0 - 100.0 fL   MCH 31.3 26.0 - 34.0 pg   MCHC 35.0 30.0 - 36.0 g/dL   RDW 13.7 11.5 - 15.5 %   Platelets 140 (L) 150 - 400 K/uL  Comprehensive metabolic panel     Status: Abnormal   Collection Time: 08/10/16  6:32 AM  Result Value Ref Range   Sodium 136 135 - 145 mmol/L   Potassium 4.2 3.5 - 5.1 mmol/L    Comment: HEMOLYSIS AT THIS LEVEL MAY AFFECT RESULT   Chloride 101 101 - 111 mmol/L   CO2 26 22 - 32 mmol/L   Glucose, Bld 155 (H) 65 - 99 mg/dL   BUN 17 6 - 20 mg/dL   Creatinine, Ser 0.90 0.61 - 1.24 mg/dL   Calcium 8.5 (L) 8.9 - 10.3 mg/dL   Total Protein 6.2 (L) 6.5 - 8.1 g/dL   Albumin 3.1 (L) 3.5 - 5.0 g/dL   AST 70 (H) 15 - 41 U/L   ALT 52 17 - 63 U/L   Alkaline Phosphatase 54 38 - 126 U/L   Total Bilirubin 1.5 (H) 0.3 - 1.2 mg/dL   GFR calc non Af Amer >60 >60 mL/min   GFR calc Af Amer >60 >60 mL/min    Comment: (NOTE) The eGFR has been calculated using the CKD EPI  equation. This calculation has not been validated in all clinical situations. eGFR's persistently <60 mL/min signify possible Chronic Kidney Disease.    Anion gap 9 5 - 15  Glucose, capillary     Status: Abnormal   Collection Time: 08/10/16  7:46 AM  Result Value Ref Range   Glucose-Capillary 162 (H) 65 -   99 mg/dL  Glucose, capillary     Status: Abnormal   Collection Time: 08/10/16 11:34 AM  Result Value Ref Range   Glucose-Capillary 145 (H) 65 - 99 mg/dL  Glucose, capillary     Status: Abnormal   Collection Time: 08/10/16  5:31 PM  Result Value Ref Range   Glucose-Capillary 138 (H) 65 - 99 mg/dL  Glucose, capillary     Status: Abnormal   Collection Time: 08/10/16  9:38 PM  Result Value Ref Range   Glucose-Capillary 164 (H) 65 - 99 mg/dL  CBC     Status: Abnormal   Collection Time: 08/11/16  4:41 AM  Result Value Ref Range   WBC 14.1 (H) 4.0 - 10.5 K/uL   RBC 4.34 4.22 - 5.81 MIL/uL   Hemoglobin 13.7 13.0 - 17.0 g/dL   HCT 38.9 (L) 39.0 - 52.0 %   MCV 89.6 78.0 - 100.0 fL   MCH 31.6 26.0 - 34.0 pg   MCHC 35.2 30.0 - 36.0 g/dL   RDW 13.8 11.5 - 15.5 %   Platelets 178 150 - 400 K/uL  Basic metabolic panel     Status: Abnormal   Collection Time: 08/11/16  4:41 AM  Result Value Ref Range   Sodium 138 135 - 145 mmol/L   Potassium 4.0 3.5 - 5.1 mmol/L   Chloride 105 101 - 111 mmol/L   CO2 22 22 - 32 mmol/L   Glucose, Bld 139 (H) 65 - 99 mg/dL   BUN 25 (H) 6 - 20 mg/dL   Creatinine, Ser 1.11 0.61 - 1.24 mg/dL   Calcium 8.7 (L) 8.9 - 10.3 mg/dL   GFR calc non Af Amer >60 >60 mL/min   GFR calc Af Amer >60 >60 mL/min    Comment: (NOTE) The eGFR has been calculated using the CKD EPI equation. This calculation has not been validated in all clinical situations. eGFR's persistently <60 mL/min signify possible Chronic Kidney Disease.    Anion gap 11 5 - 15  Glucose, capillary     Status: Abnormal   Collection Time: 08/11/16  8:15 AM  Result Value Ref Range    Glucose-Capillary 149 (H) 65 - 99 mg/dL  Glucose, capillary     Status: Abnormal   Collection Time: 08/11/16 12:35 PM  Result Value Ref Range   Glucose-Capillary 147 (H) 65 - 99 mg/dL   No results found.    Assessment/Plan Diffuse rash - began about 3 days ago and is associated with pharyngitis, cough, and eye redness - ongoing workup with ID to determine cause - general surgery consulted to perform punch biopsy  Plan - Biopsy performed and sample sent to lab for evaluation.  Procedure: Informed consent was obtained. Right mid-forearm near blister cleaned and prepped in sterile fashion. The area was locally anesthetized with 2% lidocaine. A 68m punch biopsy was taken and placed in specimen cup with formalin. Pressure was held until hemostasis achieved. A dry dressing was applied to the area biopsied. Patient tolerated procedure well and there were no complications.  BJerrye Beavers [facility] 08/11/2016, 2:07 PM Pager: [phone]: [phone]-Fri 7:00 am-4:30 pm Sat-Sun 7:00 am-11:30 am

## 2016-08-12 DIAGNOSIS — L511 Stevens-Johnson syndrome: Secondary | ICD-10-CM

## 2016-08-12 DIAGNOSIS — R070 Pain in throat: Secondary | ICD-10-CM

## 2016-08-12 DIAGNOSIS — E119 Type 2 diabetes mellitus without complications: Secondary | ICD-10-CM

## 2016-08-12 DIAGNOSIS — H1033 Unspecified acute conjunctivitis, bilateral: Secondary | ICD-10-CM

## 2016-08-12 DIAGNOSIS — K1370 Unspecified lesions of oral mucosa: Secondary | ICD-10-CM

## 2016-08-12 DIAGNOSIS — R509 Fever, unspecified: Secondary | ICD-10-CM

## 2016-08-12 LAB — BASIC METABOLIC PANEL
Anion gap: 11 (ref 5–15)
BUN: 25 mg/dL — AB (ref 6–20)
CHLORIDE: 104 mmol/L (ref 101–111)
CO2: 23 mmol/L (ref 22–32)
Calcium: 8.2 mg/dL — ABNORMAL LOW (ref 8.9–10.3)
Creatinine, Ser: 1.11 mg/dL (ref 0.61–1.24)
GFR calc Af Amer: >60 mL/min (ref >60)
GLUCOSE: 110 mg/dL — AB (ref 65–99)
POTASSIUM: 5 mmol/L (ref 3.5–5.1)
Sodium: 138 mmol/L (ref 135–145)

## 2016-08-12 LAB — GLUCOSE, CAPILLARY
GLUCOSE-CAPILLARY: 114 mg/dL — AB (ref 65–99)
GLUCOSE-CAPILLARY: 115 mg/dL — AB (ref 65–99)
Glucose-Capillary: 125 mg/dL — ABNORMAL HIGH (ref 65–99)
Glucose-Capillary: 115 mg/dL — ABNORMAL HIGH (ref 65–99)

## 2016-08-12 LAB — CBC
HEMATOCRIT: 38.8 % — AB (ref 39.0–52.0)
Hemoglobin: 13.4 g/dL (ref 13.0–17.0)
MCH: 31.4 pg (ref 26.0–34.0)
MCHC: 34.5 g/dL (ref 30.0–36.0)
MCV: 90.9 fL (ref 78.0–100.0)
PLATELETS: 231 10*3/uL (ref 150–400)
RBC: 4.27 MIL/uL (ref 4.22–5.81)
RDW: 14.5 % (ref 11.5–15.5)
WBC: 13 10*3/uL — ABNORMAL HIGH (ref 4.0–10.5)

## 2016-08-12 LAB — SEDIMENTATION RATE: Sed Rate: 46 mm/hr — ABNORMAL HIGH (ref 0–16)

## 2016-08-12 LAB — HIV-1 RNA QUANT-NO REFLEX-BLD: LOG10 HIV-1 RNA: UNDETERMINED log10copy/mL

## 2016-08-12 LAB — CMV IGM: CMV IgM: <30 AU/mL (ref 0.0–29.9)

## 2016-08-12 LAB — EBV AB TO VIRAL CAPSID AG PNL, IGG+IGM: EBV VCA IgM: <36 U/mL (ref 0.0–35.9)

## 2016-08-12 LAB — CMV ANTIBODY, IGG (EIA): CMV Ab - IgG: <0.6 U/mL (ref 0.00–0.59)

## 2016-08-12 MED ORDER — INSULIN ASPART 100 UNIT/ML ~~LOC~~ SOLN: 0.0000 [IU] | Freq: Three times a day (TID) | SUBCUTANEOUS | 11 refills | Status: AC

## 2016-08-12 MED ORDER — INSULIN ASPART 100 UNIT/ML ~~LOC~~ SOLN: 0.0000 [IU] | Freq: Every day | SUBCUTANEOUS | 11 refills | Status: AC

## 2016-08-12 NOTE — Care Management Note (Signed)
Case Management Note  Patient Details  Name: Eric Houston MRN: [id number] Date of Birth: [birth year]  Subjective/Objective:                 Admitted with viral illness and rash   Action/Plan:  DC to home, self care.   Expected Discharge Date:                  Expected Discharge Plan:  Home/Self Care  In-House Referral:  NA  Discharge planning Services  CM Consult  Post Acute Care Choice:  NA Choice offered to:  NA  DME Arranged:  N/A DME Agency:  NA  HH Arranged:  NA HH Agency:  NA  Status of Service:  Completed, signed off  If discussed at [place] of Stay Meetings, dates discussed:    Additional Comments:  Carles Collet, RN 08/12/2016, 12:36 PM

## 2016-08-12 NOTE — Progress Notes (Signed)
Patient ID: Eric Houston, male   DOB: 1956-06-02, 60 y.o.   MRN: [id number]  [facility] Surgery Progress Note     Subjective: Denies pain at biopsy site. States that he has been unable to keep it covered because the bandage fall off.  Objective: Vital signs in last 24 hours: Temp:  [98.1 F (36.7 C)-100 F (37.8 C)] 98.4 F (36.9 C) (11/15 0536) Pulse Rate:  [78-88] 81 (11/15 0536) Resp:  [16-18] 18 (11/15 0536) BP: (121-147)/(62-84) 125/65 (11/15 0536) SpO2:  [97 %-98 %] 97 % (11/15 0536) Weight:  [288 lb 12.8 oz (131 kg)] 288 lb 12.8 oz (131 kg) (11/15 0536) Last BM Date: 08/08/16  Intake/Output from previous day: 11/14 0701 - 11/15 0700 In: 3128.8 [P.O.:820; I.V.:2308.8] Out: 350 [Urine:350] Intake/Output this shift: No intake/output data recorded.  PE: Gen:  drowsy, NAD, pleasant Pulm:  Effort normal Abd: Soft, NT/ND RUE: biopsy site on right forearm clean with no concern for infection  Lab Results:   Recent Labs  08/11/16 0441 08/12/16 0446  WBC 14.1* 13.0*  HGB 13.7 13.4  HCT 38.9* 38.8*  PLT 178 231   BMET  Recent Labs  08/11/16 0441 08/12/16 0446  NA 138 138  K 4.0 5.0  CL 105 104  CO2 22 23  GLUCOSE 139* 110*  BUN 25* 25*  CREATININE 1.11 1.11  CALCIUM 8.7* 8.2*   PT/INR  Recent Labs  08/09/16 1007  LABPROT 13.5  INR 1.03   CMP     Component Value Date/Time   NA 138 08/12/2016 0446   K 5.0 08/12/2016 0446   CL 104 08/12/2016 0446   CO2 23 08/12/2016 0446   GLUCOSE 110 (H) 08/12/2016 0446   BUN 25 (H) 08/12/2016 0446   CREATININE 1.11 08/12/2016 0446   CREATININE 1.15 07/10/2016 0943   CALCIUM 8.2 (L) 08/12/2016 0446   PROT 7.3 08/11/2016 1550   ALBUMIN 3.4 (L) 08/11/2016 1550   AST 71 (H) 08/11/2016 1550   ALT 85 (H) 08/11/2016 1550   ALKPHOS 61 08/11/2016 1550   BILITOT 0.7 08/11/2016 1550   GFRNONAA >60 08/12/2016 0446   GFRNONAA 63 05/25/2016 0955   GFRAA >60 08/12/2016 0446   GFRAA 73 05/25/2016 0955    Lipase     Component Value Date/Time   LIPASE 27 08/09/2016 1007       Studies/Results: No results found.  Anti-infectives: Anti-infectives    Start     Dose/Rate Route Frequency Ordered Stop   08/09/16 1700  penicillin v potassium (VEETID) 250 MG/5ML solution 500 mg  Status:  Discontinued     500 mg Oral 2 times daily 08/09/16 1648 08/11/16 1301       Assessment/Plan Diffuse rash S/p punch biopsy 11/14  Plan - biopsy site clean with no concern for infection. Results pending. Recommend daily dry dressing changes until punch site closes (if bandaid or tape will not stick recommend wrapping kerlex around forearm). General surgery will sign off, please call with any concerns.   LOS: 2 days    Jerrye Beavers , [facility] 08/12/2016, 8:47 AM Pager: [phone] Consults: [phone] Mon-Fri 7:00 am-4:30 pm Sat-Sun 7:00 am-11:30 am

## 2016-08-12 NOTE — Discharge Summary (Signed)
Physician Discharge Summary  Eric Houston [id number] DOB: [birth year] DOA: 08/09/2016  PCP: Alphonzo Lemmings, MD  Admit date: 08/09/2016 Discharge date: 08/12/2016   Recommendations for Outpatient Follow-Up:   To [facility] for dermatologic evaluation  Discharge Diagnosis:   Principal Problem:   Viral illness Active Problems:   Essential hypertension   Diabetes mellitus, type 2 ([place])   Sleep apnea   Hydrocephalus   Severe obesity (BMI >= 40) (HCC)   Conjunctivitis of both eyes   Rash   Sore throat   Dehydration   COPD (chronic obstructive pulmonary disease) (HCC)   Thrombocytopenia (HCC)   Bilateral lower extremity edema   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 male with medical history significant for diabetes, obesity, sleep apnea on C Pap, hypertension, COPD not on home oxygen presents to the emergency department with the chief complaint of persistent sore throat/cough/eye redness and swelling. Initial evaluation in the emergency department reveals echo picture concerning for dehydration, systemic viral illness  Information is obtained from the patient and the chart. He reports being in his usual state of health until 5 days ago when he developed gradual onset of upper respiratory infection symptoms. Started with a headache nasal congestion that persisted and worsened. He then developed sore throat dry cough, bilateral eye redness with drainage and swelling. Reports decreased oral intake due to severe pain with swallowing. Yesterday he developed a rash upper body bilateral arms, neck face lips ears. He denies any blistering or peeling of the skin. He denies headache visual disturbances numbness tingling of extremities. He denies  fever chills nausea vomiting diarrhea. He denies chest pain palpitations shortness of breath. He does have a history of COPD and sleep apnea and wears sleep apnea at night but does not have home oxygen. He denies worsening lower extremity edema and endorses chronic low back pain.  ED Course: In the emergency department max temperature 99.2 these hemodynamically stable and initially tachycardic. He is provided with 500 mL of normal saline and heart rate improves. He is provided with Solu-Medrol, Benadryl and Pepcid. The time of admission he appears stable but acutely ill  Patient also recently (1 week) started on allopurinol and also had LE cath to evaluate for P[facility] at [facility] Course by Problem:    Diffuse rash - likely severe TEN/SJS with > 30% of body and mucous membrane involvement-- may be related to recent start of allopurinol -rapid strep as well as culture negative -s/p punch biopsy-- results pending -appreciate ID input - transfer to [facility] with dermatology availability -continuing isolation with concern for viral etiology though less likely.   -HIV quick screen negative (HIV 1 RNA pending)-- patient denies recent sexual activity -RPR negative -influenza negative -rubella screen 30.90 (immune) -CMV negative EBV PENDING  Diabetes. On oral agents at home -Hold oral agents for now -Sliding scale for optimal control -Obtain a hemoglobin A1c  Hypertension. Fair control in the emergency department. Home medications include Lasix, Cozaar -Hold Lasix for now -We'll resume home Cozaar -Monitor   Sleep apnea. Wears C Pap at home -Respiratory consult for C Pap  Thrombocytopenia - now within normal limits  COPD. Stable. Not on oxygen at home. Does use home inhalers.  Chest x-ray as noted above. Oxygen saturation level greater than 90% on room air -Continue home meds -Monitor oxygen saturation level -supplemental oxygen as needed    Medical  Consultants:    ID  Surgery (punch biopsy)   Discharge Exam:   Vitals:   08/11/16 2232 08/12/16 0536  BP: 133/62 125/65  Pulse: 88 81  Resp: 18 18  Temp: 100 F (37.8 C) 98.4 F (36.9 C)   Vitals:   08/11/16 1352 08/11/16 2050 08/11/16 2232 08/12/16 0536  BP: (!) 147/84  133/62 125/65  Pulse: 78  88 81  Resp: 18  18 18   Temp: 98.3 F (36.8 C)  100 F (37.8 C) 98.4 F (36.9 C)  TempSrc: Oral  Oral Oral  SpO2:  98% 97% 97%  Weight:    131 kg (288 lb 12.8 oz)  Height:        Back - diffuse confluent blanching erythema on the upper 75% of back; front with some confluence, blanching erythema; face with sloughing, scabbing skin including scalp, both arms with some bullous lesions   The results of significant diagnostics from this hospitalization (including imaging, microbiology, ancillary and laboratory) are listed below for reference.     Procedures and Diagnostic Studies:   Dg Chest 2 View  Result Date: 08/09/2016 CLINICAL DATA:  Patient with chronic shortness of breath and cough. Worsening for 5 days. EXAM: CHEST  2 VIEW COMPARISON:  Chest radiograph 09/24/2015. FINDINGS: Showing catheter projects over the right upper hemi thorax. Anterior cervical spinal fusion hardware. Stable enlarged cardiac and mediastinal contours. No consolidative pulmonary opacities. No pleural effusion or pneumothorax. Mid thoracic spine degenerative changes. IMPRESSION: No acute cardiopulmonary process. Electronically Signed   By: Lovey Newcomer M.D.   On: 08/09/2016 08:57     Labs:   Basic Metabolic Panel:  Recent Labs Lab 08/09/16 1007 08/10/16 0632 08/11/16 0441 08/12/16 0446  NA 136 136 138 138  K 3.5 4.2 4.0 5.0  CL 98* 101 105 104  CO2 25 26 22 23   GLUCOSE 121* 155* 139* 110*  BUN 13 17 25* 25*  CREATININE 1.02 0.90 1.11 1.11  CALCIUM 8.7* 8.5* 8.7* 8.2*   GFR Estimated Creatinine Clearance (by C-G formula based on SCr of 1.11 mg/dL) Male: 73.7 mL/min Male: 89.4  mL/min Liver Function Tests:  Recent Labs Lab 08/09/16 1007 08/10/16 0632 08/11/16 1550  AST 78* 70* 71*  ALT 55 52 85*  ALKPHOS 63 54 61  BILITOT 1.3* 1.5* 0.7  PROT 6.6 6.2* 7.3  ALBUMIN 3.5 3.1* 3.4*    Recent Labs Lab 08/09/16 1007  LIPASE 27   No results for input(s): AMMONIA in the last 168 hours. Coagulation profile  Recent Labs Lab 08/09/16 1007  INR 1.03    CBC:  Recent Labs Lab 08/09/16 1007 08/10/16 0632 08/11/16 0441 08/12/16 0446  WBC 7.9 12.8* 14.1* 13.0*  NEUTROABS 5.3  --   --   --   HGB 13.8 12.9* 13.7 13.4  HCT 39.5 36.9* 38.9* 38.8*  MCV 89.6 89.6 89.6 90.9  PLT 122* 140* 178 231   Cardiac Enzymes: No results for input(s): CKTOTAL, CKMB, CKMBINDEX, TROPONINI in the last 168 hours. BNP: Invalid input(s): POCBNP CBG:  Recent Labs Lab 08/11/16 1235 08/11/16 1814 08/11/16 2231 08/12/16 0931 08/12/16 1134  GLUCAP 147* 126* 125* 114* 125*   D-Dimer No results for input(s): DDIMER in the last 72 hours. Hgb A1c  Recent Labs  08/09/16 1702  HGBA1C 5.7*   Lipid Profile  No results for input(s): CHOL, HDL, LDLCALC, TRIG, CHOLHDL, LDLDIRECT in the last 72 hours. Thyroid function studies No results for input(s): TSH, T4TOTAL, T3FREE, THYROIDAB in the last 72 hours.  Invalid input(s): FREET3 Anemia work up No results for input(s): VITAMINB12, FOLATE, FERRITIN, TIBC, IRON, RETICCTPCT in the last 72 hours. Microbiology Recent Results (from the past 240 hour(s))  Rapid strep screen (not at [facility])     Status: None   Collection Time: 08/09/16 10:10 AM  Result Value Ref Range Status   Streptococcus, Group A Screen (Direct) NEGATIVE NEGATIVE Final    Comment: (NOTE) A Rapid Antigen test may result negative if the antigen level in the sample is below the detection level of this test. The FDA has not cleared this test as a stand-alone test therefore the rapid antigen negative result has reflexed to a Group A Strep culture.   Culture,  group A strep     Status: None   Collection Time: 08/09/16 10:10 AM  Result Value Ref Range Status   Specimen Description THROAT  Final   Special Requests NONE Reflexed from [id number]  Final   Culture NO GROUP A STREP (S.PYOGENES) ISOLATED  Final   Report Status 08/11/2016 FINAL  Final     Discharge Instructions:   Discharge Instructions    Discharge instructions    Complete by:  As directed    Clear diet   Increase activity slowly    Complete by:  As directed        Medication List    STOP taking these medications   aspirin EC 81 MG tablet   furosemide 80 MG tablet Commonly known as:  LASIX   metFORMIN 500 MG tablet Commonly known as:  GLUCOPHAGE   pioglitazone 45 MG tablet Commonly known as:  ACTOS   potassium chloride SA 20 MEQ tablet Commonly known as:  K-DUR,KLOR-CON     TAKE these medications   atorvastatin 20 MG tablet Commonly known as:  LIPITOR Take 1 tablet (20 mg total) by mouth daily.   budesonide-formoterol 160-4.5 MCG/ACT inhaler Commonly known as:  SYMBICORT Inhale 2 puffs into the lungs 2 (two) times daily.   guaiFENesin-dextromethorphan 100-10 MG/5ML syrup Commonly known as:  ROBITUSSIN DM Take 5 mLs by mouth every 4 (four) hours as needed for cough.   insulin aspart 100 UNIT/ML injection Commonly known as:  novoLOG Inject 0-5 Units into the skin at bedtime.   insulin aspart 100 UNIT/ML injection Commonly known as:  novoLOG Inject 0-9 Units into the skin 3 (three) times daily with meals.   losartan 50 MG tablet Commonly known as:  COZAAR Take 50 mg by mouth daily.   omeprazole 20 MG capsule Commonly known as:  PRILOSEC Take 20 mg by mouth daily.   oxyCODONE-acetaminophen 10-325 MG tablet Commonly known as:  PERCOCET Take 10-325 tablets by mouth every 8 (eight) hours as needed for pain.   VENTOLIN HFA 108 (90 Base) MCG/ACT inhaler Generic drug:  albuterol Inhale 2 puffs into the lungs every 4 (four) hours as needed.          Time coordinating discharge: 35 min  Signed:  Rheagan Nayak U [place]   [facility] 08/12/2016, 12:15 PM

## 2016-08-12 NOTE — Progress Notes (Addendum)
    [place] for Infectious Disease   Reason for visit: Follow up on rash  Interval History: continued worsening rash, Tmax up to 100 overnight, improved throat pain, improved lesions in mouth by his report but rash worsening, uncomfortable on back, sloughin  Physical Exam: Constitutional:  Vitals:   08/11/16 2232 08/12/16 0536  BP: 133/62 125/65  Pulse: 88 81  Resp: 18 18  Temp: 100 F (37.8 C) 98.4 F (36.9 C)   patient appears in mild distress with rash Eyes: anicteric HENT: moist mucous membranes Respiratory: Normal respiratory effort; CTA B Cardiovascular: RRR Skin: Back - diffuse confluent blanching erythema on the upper 75% of back; front with some confluence, blanching erythema; face with sloughing, scabbing skin including scalp, both arms with some bullous lesions, confluent erythema  Review of Systems: Constitutional: positive for fatigue, malaise and anorexia; some chills Respiratory: negative for cough Gastrointestinal: negative for diarrhea  Lab Results  Component Value Date   WBC 13.0 (H) 08/12/2016   HGB 13.4 08/12/2016   HCT 38.8 (L) 08/12/2016   MCV 90.9 08/12/2016   PLT 231 08/12/2016    Lab Results  Component Value Date   CREATININE 1.11 08/12/2016   BUN 25 (H) 08/12/2016   NA 138 08/12/2016   K 5.0 08/12/2016   CL 104 08/12/2016   CO2 23 08/12/2016    Lab Results  Component Value Date   ALT 85 (H) 08/11/2016   AST 71 (H) 08/11/2016   ALKPHOS 61 08/11/2016     Microbiology: Recent Results (from the past 240 hour(s))  Rapid strep screen (not at [facility])     Status: None   Collection Time: 08/09/16 10:10 AM  Result Value Ref Range Status   Streptococcus, Group A Screen (Direct) NEGATIVE NEGATIVE Final    Comment: (NOTE) A Rapid Antigen test may result negative if the antigen level in the sample is below the detection level of this test. The FDA has not cleared this test as a stand-alone test therefore the rapid antigen negative  result has reflexed to a Group A Strep culture.   Culture, group A strep     Status: None   Collection Time: 08/09/16 10:10 AM  Result Value Ref Range Status   Specimen Description THROAT  Final   Special Requests NONE Reflexed from [id number]  Final   Culture NO GROUP A STREP (S.PYOGENES) ISOLATED  Final   Report Status 08/11/2016 FINAL  Final    Impression/Plan:  1. Diffuse rash - DDx remains broad.  Appreciate surgery biopsy of area.  Continuing fluids.  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  I have discussed with Dr Eliseo Squires and would consider transfer to [facility] with dermatology availability. I am continuing isolation with concern for viral etiology though less likely.   Pictures available in media  I discussed with pharmacy and appreciate their input.  He has been on allopurinol and this is stopped.  This has been associated with SJS.    2. Thrombocytopenia - now within normal limits  3. HTN - is on his bp meds.  No signs of shock

## 2016-08-13 ENCOUNTER — Inpatient Hospital Stay (HOSPITAL_COMMUNITY): Payer: Medicare HMO

## 2016-08-13 DIAGNOSIS — B349 Viral infection, unspecified: Secondary | ICD-10-CM

## 2016-08-13 DIAGNOSIS — R509 Fever, unspecified: Secondary | ICD-10-CM

## 2016-08-13 LAB — BASIC METABOLIC PANEL
ANION GAP: 10 (ref 5–15)
BUN: 24 mg/dL — ABNORMAL HIGH (ref 6–20)
CALCIUM: 8.1 mg/dL — AB (ref 8.9–10.3)
CHLORIDE: 106 mmol/L (ref 101–111)
CO2: 22 mmol/L (ref 22–32)
Creatinine, Ser: 1.04 mg/dL (ref 0.61–1.24)
GFR calc Af Amer: >60 mL/min (ref >60)
GFR calc non Af Amer: >60 mL/min (ref >60)
GLUCOSE: 110 mg/dL — AB (ref 65–99)
POTASSIUM: 3.7 mmol/L (ref 3.5–5.1)
Sodium: 138 mmol/L (ref 135–145)

## 2016-08-13 LAB — ECHOCARDIOGRAM COMPLETE
Height: 65 in
Weight: 4620.84 oz

## 2016-08-13 LAB — CBC
HEMATOCRIT: 38.5 % — AB (ref 39.0–52.0)
Hemoglobin: 13.2 g/dL (ref 13.0–17.0)
MCH: 31 pg (ref 26.0–34.0)
MCHC: 34.3 g/dL (ref 30.0–36.0)
MCV: 90.4 fL (ref 78.0–100.0)
Platelets: 203 10*3/uL (ref 150–400)
RBC: 4.26 MIL/uL (ref 4.22–5.81)
RDW: 14.1 % (ref 11.5–15.5)
WBC: 8.6 10*3/uL (ref 4.0–10.5)

## 2016-08-13 LAB — GLUCOSE, CAPILLARY
GLUCOSE-CAPILLARY: 115 mg/dL — AB (ref 65–99)
GLUCOSE-CAPILLARY: 132 mg/dL — AB (ref 65–99)
Glucose-Capillary: 122 mg/dL — ABNORMAL HIGH (ref 65–99)
Glucose-Capillary: 126 mg/dL — ABNORMAL HIGH (ref 65–99)

## 2016-08-13 NOTE — Progress Notes (Signed)
  Echocardiogram 2D Echocardiogram has been performed.  Eric Houston 08/13/2016, 1:04 PM

## 2016-08-13 NOTE — Progress Notes (Signed)
Bed available at [facility] room 964 in [place]. Vital signs stable. Report given to Dexteria Gardner Candle. Care link notified of transfer and report given to [employer]. Will pass on to night shift Rn. Wyonia Hough 7:03 PM

## 2016-08-13 NOTE — Progress Notes (Signed)
PROGRESS NOTE                                                                                                                                                                                                             Patient Demographics:    Eric Houston, is a 60 y.o. male, DOB - 12-19-1955, [id number]  Admit date - 08/09/2016   Admitting Physician Elwin Mocha, MD  Outpatient Primary MD for the patient is Alphonzo Lemmings, MD  LOS - 3  Outpatient Specialists:None  Chief Complaint  Patient presents with  . Sore Throat  . Cough       Brief Narrative   Please refer to discharge summary from 11/15 for details, in brief, 60 year old obese male with history of diabetes, OSA on CPAP, hypertension and COPD not on home O2 presented to the ED with persistent sore throat with cough associated with redness in his eyes and swelling. Patient reported he was well 5 days prior to admission when he developed upper respiratory symptoms started with a headache and nasal congestion and worsened. He reported poor by mouth intake and pain with swallowing. He then developed rash in his upper body and bilateral arms extending to the neck, face, lips and ears. Denies any blisters or peeling off skin. Denies any recent travel or sick contact. He reports recently being started on allopurinol for gout. Denied headache, blurred vision, fevers, chills, nausea, vomiting, chest pain, shortness of breath, abdominal pain, bowel or urinary symptoms.    Subjective:   Complains of burning pain symptoms in his back   Assessment  & Plan :    Principal Problem: Diffuse  rash Diffuse involvement of his trunk, back, extremities, and face. Secondary to acute viral illness versus recent use of allopurinol. Strep and cultures negative. 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 Noted for small areas of blisters in his right forearm this morning.  Punch  biopsy result pending. ID and surgery consult appreciated. [place] consulted for transfer with dermatology evaluation. Patient has been excepted that an awaiting bed placement. Continue supportive care with pain control, antiemetics.    Active Problems:   Essential hypertension Stable. Holding Lasix. Continue Cozaar.    Diabetes mellitus, type 2 ([place]) On oral hypoglycemics at home. Being held and on sliding scale coverage.   Obstructive sleep apnea Continue  nighttime CPAP.  COPD Continue home inhalers. Not on home O2.        Code Status : Full code  Family Communication  : None at bedside  Disposition Plan  : Awaiting transfer to [place]  :  ID  Procedures  : Punch biopsy  DVT Prophylaxis  : SCDs  Lab Results  Component Value Date   PLT 203 08/13/2016    Antibiotics  :    Anti-infectives    Start     Dose/Rate Route Frequency Ordered Stop   08/09/16 1700  penicillin v potassium (VEETID) 250 MG/5ML solution 500 mg  Status:  Discontinued     500 mg Oral 2 times daily 08/09/16 1648 08/11/16 1301        Objective:   Vitals:   08/12/16 1310 08/12/16 1949 08/12/16 2245 08/13/16 0458  BP: 115/74  (!) 105/43 (!) 98/53  Pulse: 88  80 86  Resp: 18  (!) 22 (!) 22  Temp: 99.8 F (37.7 C)  99 F (37.2 C) 98.7 F (37.1 C)  TempSrc: Oral  Oral Oral  SpO2: 95% 94% 95% 93%  Weight:      Height:        Wt Readings from Last 3 Encounters:  08/12/16 131 kg (288 lb 12.8 oz)  07/10/16 (!) 137.7 kg (303 lb 9.6 oz)  05/25/16 (!) 141.1 kg (311 lb)     Intake/Output Summary (Last 24 hours) at 08/13/16 1447 Last data filed at 08/13/16 0826  Gross per 24 hour  Intake           822.92 ml  Output              700 ml  Net           122.92 ml     Physical Exam  Gen: Appears fatigue HEENT: Rash over the face and mucous membrane Chest: clear b/l, no added sounds, diffuse macular rash CVS: N S1&S2, no murmurs, rubs or  gallop GI: soft, NT, ND, BS+ Musculoskeletal: Diffuse macular rash, more prominent in the back with warmth. Small area blister in the right forearm CNS: Alert and oriented    Data Review:    CBC  Recent Labs Lab 08/09/16 1007 08/10/16 0632 08/11/16 0441 08/12/16 0446 08/13/16 0540  WBC 7.9 12.8* 14.1* 13.0* 8.6  HGB 13.8 12.9* 13.7 13.4 13.2  HCT 39.5 36.9* 38.9* 38.8* 38.5*  PLT 122* 140* 178 231 203  MCV 89.6 89.6 89.6 90.9 90.4  MCH 31.3 31.3 31.6 31.4 31.0  MCHC 34.9 35.0 35.2 34.5 34.3  RDW 13.6 13.7 13.8 14.5 14.1  LYMPHSABS 1.2  --   --   --   --   MONOABS 0.9  --   --   --   --   EOSABS 0.4  --   --   --   --   BASOSABS 0.1  --   --   --   --     Chemistries   Recent Labs Lab 08/09/16 1007 08/10/16 0632 08/11/16 0441 08/11/16 1550 08/12/16 0446 08/13/16 0540  NA 136 136 138  --  138 138  K 3.5 4.2 4.0  --  5.0 3.7  CL 98* 101 105  --  104 106  CO2 25 26 22   --  23 22  GLUCOSE 121* 155* 139*  --  110* 110*  BUN 13 17 25*  --  25* 24*  CREATININE 1.02 0.90 1.11  --  1.11 1.04  CALCIUM 8.7* 8.5* 8.7*  --  8.2* 8.1*  AST 78* 70*  --  71*  --   --   ALT 55 52  --  85*  --   --   ALKPHOS 63 54  --  61  --   --   BILITOT 1.3* 1.5*  --  0.7  --   --    ------------------------------------------------------------------------------------------------------------------ No results for input(s): CHOL, HDL, LDLCALC, TRIG, CHOLHDL, LDLDIRECT in the last 72 hours.  Lab Results  Component Value Date   HGBA1C 5.7 (H) 08/09/2016   ------------------------------------------------------------------------------------------------------------------ No results for input(s): TSH, T4TOTAL, T3FREE, THYROIDAB in the last 72 hours.  Invalid input(s): FREET3 ------------------------------------------------------------------------------------------------------------------ No results for input(s): VITAMINB12, FOLATE, FERRITIN, TIBC, IRON, RETICCTPCT in the last 72  hours.  Coagulation profile  Recent Labs Lab 08/09/16 1007  INR 1.03    No results for input(s): DDIMER in the last 72 hours.  Cardiac Enzymes No results for input(s): CKMB, TROPONINI, MYOGLOBIN in the last 168 hours.  Invalid input(s): CK ------------------------------------------------------------------------------------------------------------------ No results found for: BNP  Inpatient Medications  Scheduled Meds: . aspirin EC  81 mg Oral Daily  . atorvastatin  20 mg Oral Daily  . enoxaparin (LOVENOX) injection  40 mg Subcutaneous Q24H  . insulin aspart  0-5 Units Subcutaneous QHS  . insulin aspart  0-9 Units Subcutaneous TID WC  . losartan  50 mg Oral Daily  . mometasone-formoterol  2 puff Inhalation BID  . pantoprazole  40 mg Oral Daily  . potassium chloride SA  40 mEq Oral Daily   Continuous Infusions: . sodium chloride 100 mL/hr at 08/13/16 0918   PRN Meds:.acetaminophen **OR** acetaminophen, albuterol, guaiFENesin-dextromethorphan, lidocaine, lip balm, ondansetron **OR** ondansetron (ZOFRAN) IV, oxyCODONE-acetaminophen **AND** oxyCODONE, phenol, phenol-menthol, zolpidem  Micro Results Recent Results (from the past 240 hour(s))  Rapid strep screen (not at [facility])     Status: None   Collection Time: 08/09/16 10:10 AM  Result Value Ref Range Status   Streptococcus, Group A Screen (Direct) NEGATIVE NEGATIVE Final    Comment: (NOTE) A Rapid Antigen test may result negative if the antigen level in the sample is below the detection level of this test. The FDA has not cleared this test as a stand-alone test therefore the rapid antigen negative result has reflexed to a Group A Strep culture.   Culture, group A strep     Status: None   Collection Time: 08/09/16 10:10 AM  Result Value Ref Range Status   Specimen Description THROAT  Final   Special Requests NONE Reflexed from [id number]  Final   Culture NO GROUP A STREP (S.PYOGENES) ISOLATED  Final   Report Status  08/11/2016 FINAL  Final  Culture, blood (routine x 2)     Status: None (Preliminary result)   Collection Time: 08/11/16  4:45 PM  Result Value Ref Range Status   Specimen Description BLOOD RIGHT ANTECUBITAL  Final   Special Requests IN PEDIATRIC BOTTLE 2CC  Final   Culture NO GROWTH < 24 HOURS  Final   Report Status PENDING  Incomplete  Culture, blood (routine x 2)     Status: None (Preliminary result)   Collection Time: 08/11/16  4:50 PM  Result Value Ref Range Status   Specimen Description BLOOD RIGHT HAND  Final   Special Requests IN PEDIATRIC BOTTLE 2CC  Final   Culture NO GROWTH < 24 HOURS  Final   Report Status PENDING  Incomplete    Radiology Reports Dg Chest  2 View  Result Date: 08/09/2016 CLINICAL DATA:  Patient with chronic shortness of breath and cough. Worsening for 5 days. EXAM: CHEST  2 VIEW COMPARISON:  Chest radiograph 09/24/2015. FINDINGS: Showing catheter projects over the right upper hemi thorax. Anterior cervical spinal fusion hardware. Stable enlarged cardiac and mediastinal contours. No consolidative pulmonary opacities. No pleural effusion or pneumothorax. Mid thoracic spine degenerative changes. IMPRESSION: No acute cardiopulmonary process. Electronically Signed   By: Lovey Newcomer M.D.   On: 08/09/2016 08:57    Time Spent in minutes  25   Louellen Molder M.D on 08/13/2016 at 2:47 PM  Between 7am to 7pm - Pager - [phone]  After 7pm go to [email or web address] - password [facility]  [facility] -  Office  [phone]

## 2016-08-13 NOTE — Care Management Important Message (Signed)
Important Message  Patient Details  Name: Eric Houston MRN: [id number] Date of Birth: 1955/10/21   Medicare Important Message Given:  Yes    Carles Collet, RN 08/13/2016, 11:08 AM

## 2016-08-13 NOTE — Progress Notes (Signed)
Patient refused to wear CPAP tonight, stated he doesn't want to wear but has one at home that he does wear. Rt made pt aware that if he changed his mind to call.

## 2016-08-13 NOTE — Care Management Note (Signed)
Case Management Note  Patient Details  Name: Eric Houston MRN: [id number] Date of Birth: [birth year]  Subjective/Objective:                 Transfer pending to [facility] when beds become available today.   Action/Plan:   Expected Discharge Date:                  Expected Discharge Plan:  Acute to Acute Transfer ([facility])  In-House Referral:  NA  Discharge planning Services  CM Consult  Post Acute Care Choice:  NA Choice offered to:  NA  DME Arranged:  N/A DME Agency:  NA  HH Arranged:  NA HH Agency:  NA  Status of Service:  Completed, signed off  If discussed at [place] of Stay Meetings, dates discussed:    Additional Comments:  Carles Collet, RN 08/13/2016, 10:27 AM

## 2016-08-16 LAB — CULTURE, BLOOD (ROUTINE X 2)
CULTURE: NO GROWTH
Culture: NO GROWTH

## 2016-08-24 ENCOUNTER — Ambulatory Visit: Payer: Medicare HMO | Admitting: Internal Medicine

## 2017-09-25 ENCOUNTER — Emergency Department (HOSPITAL_COMMUNITY): Payer: Medicare HMO

## 2017-09-25 ENCOUNTER — Emergency Department (HOSPITAL_COMMUNITY)
Admission: EM | Admit: 2017-09-25 | Discharge: 2017-09-26 | Disposition: A | Discharge status: Home / Self Care | Payer: Medicare HMO | Attending: Emergency Medicine | Admitting: Emergency Medicine

## 2017-09-25 ENCOUNTER — Other Ambulatory Visit: Payer: Self-pay

## 2017-09-25 ENCOUNTER — Encounter (HOSPITAL_COMMUNITY): Payer: Self-pay | Admitting: *Deleted

## 2017-09-25 DIAGNOSIS — Z87891 Personal history of nicotine dependence: Secondary | ICD-10-CM | POA: Diagnosis not present

## 2017-09-25 DIAGNOSIS — R42 Dizziness and giddiness: Secondary | ICD-10-CM | POA: Insufficient documentation

## 2017-09-25 DIAGNOSIS — Z79899 Other long term (current) drug therapy: Secondary | ICD-10-CM | POA: Diagnosis not present

## 2017-09-25 DIAGNOSIS — R4781 Slurred speech: Secondary | ICD-10-CM | POA: Diagnosis not present

## 2017-09-25 DIAGNOSIS — Z982 Presence of cerebrospinal fluid drainage device: Secondary | ICD-10-CM | POA: Insufficient documentation

## 2017-09-25 DIAGNOSIS — J449 Chronic obstructive pulmonary disease, unspecified: Secondary | ICD-10-CM | POA: Insufficient documentation

## 2017-09-25 DIAGNOSIS — E119 Type 2 diabetes mellitus without complications: Secondary | ICD-10-CM | POA: Insufficient documentation

## 2017-09-25 DIAGNOSIS — I10 Essential (primary) hypertension: Secondary | ICD-10-CM | POA: Insufficient documentation

## 2017-09-25 DIAGNOSIS — R51 Headache: Secondary | ICD-10-CM | POA: Diagnosis present

## 2017-09-25 DIAGNOSIS — R519 Headache, unspecified: Secondary | ICD-10-CM

## 2017-09-25 DIAGNOSIS — Z794 Long term (current) use of insulin: Secondary | ICD-10-CM | POA: Insufficient documentation

## 2017-09-25 LAB — RAPID URINE DRUG SCREEN, HOSP PERFORMED
AMPHETAMINES: NOT DETECTED
Barbiturates: NOT DETECTED
Benzodiazepines: NOT DETECTED
COCAINE: NOT DETECTED
OPIATES: NOT DETECTED
TETRAHYDROCANNABINOL: NOT DETECTED

## 2017-09-25 LAB — CBC
HCT: 40.1 % (ref 39.0–52.0)
Hemoglobin: 13.5 g/dL (ref 13.0–17.0)
MCH: 30.7 pg (ref 26.0–34.0)
MCHC: 33.7 g/dL (ref 30.0–36.0)
MCV: 91.1 fL (ref 78.0–100.0)
PLATELETS: 232 10*3/uL (ref 150–400)
RBC: 4.4 MIL/uL (ref 4.22–5.81)
RDW: 13.9 % (ref 11.5–15.5)
WBC: 8.5 10*3/uL (ref 4.0–10.5)

## 2017-09-25 LAB — I-STAT TROPONIN, ED: Troponin i, poc: 0.01 ng/mL (ref 0.00–0.08)

## 2017-09-25 LAB — URINALYSIS, ROUTINE W REFLEX MICROSCOPIC
BACTERIA UA: NONE SEEN
BILIRUBIN URINE: NEGATIVE
Glucose, UA: NEGATIVE mg/dL
KETONES UR: NEGATIVE mg/dL
LEUKOCYTES UA: NEGATIVE
Nitrite: NEGATIVE
PROTEIN: 100 mg/dL — AB
SPECIFIC GRAVITY, URINE: 1.017 (ref 1.005–1.030)
pH: 5 (ref 5.0–8.0)

## 2017-09-25 LAB — COMPREHENSIVE METABOLIC PANEL
ALBUMIN: 4 g/dL (ref 3.5–5.0)
ALT: 19 U/L (ref 17–63)
ANION GAP: 8 (ref 5–15)
AST: 18 U/L (ref 15–41)
Alkaline Phosphatase: 73 U/L (ref 38–126)
BUN: 17 mg/dL (ref 6–20)
CO2: 28 mmol/L (ref 22–32)
Calcium: 9 mg/dL (ref 8.9–10.3)
Chloride: 104 mmol/L (ref 101–111)
Creatinine, Ser: 0.86 mg/dL (ref 0.61–1.24)
GFR calc Af Amer: >60 mL/min (ref >60)
GFR calc non Af Amer: >60 mL/min (ref >60)
GLUCOSE: 95 mg/dL (ref 65–99)
POTASSIUM: 4.1 mmol/L (ref 3.5–5.1)
SODIUM: 140 mmol/L (ref 135–145)
TOTAL PROTEIN: 7.3 g/dL (ref 6.5–8.1)
Total Bilirubin: 0.8 mg/dL (ref 0.3–1.2)

## 2017-09-25 LAB — DIFFERENTIAL
Basophils Absolute: 0 10*3/uL (ref 0.0–0.1)
Basophils Relative: 1 %
EOS ABS: 0.2 10*3/uL (ref 0.0–0.7)
EOS PCT: 3 %
Lymphocytes Relative: 24 %
Lymphs Abs: 2 10*3/uL (ref 0.7–4.0)
Monocytes Absolute: 1.4 10*3/uL — ABNORMAL HIGH (ref 0.1–1.0)
Monocytes Relative: 16 %
NEUTROS PCT: 56 %
Neutro Abs: 4.9 10*3/uL (ref 1.7–7.7)

## 2017-09-25 LAB — PROTIME-INR
INR: 0.99
PROTHROMBIN TIME: 13 s (ref 11.4–15.2)

## 2017-09-25 LAB — ETHANOL: Alcohol, Ethyl (B): <10 mg/dL (ref <10)

## 2017-09-25 LAB — APTT: aPTT: 32 seconds (ref 24–36)

## 2017-09-25 MED ORDER — HYDRALAZINE HCL 20 MG/ML IJ SOLN
10.0000 mg | Freq: Once | INTRAMUSCULAR | Status: AC
Start: 2017-09-25 — End: 2017-09-25
  Administered 2017-09-25: 10 mg via INTRAVENOUS
  Filled 2017-09-25: qty 1

## 2017-09-25 MED ORDER — DIPHENHYDRAMINE HCL 50 MG/ML IJ SOLN
25.0000 mg | Freq: Once | INTRAMUSCULAR | Status: AC
  Administered 2017-09-26: 25 mg via INTRAVENOUS
  Filled 2017-09-25: qty 1

## 2017-09-25 MED ORDER — METOCLOPRAMIDE HCL 5 MG/ML IJ SOLN
10.0000 mg | Freq: Once | INTRAMUSCULAR | Status: AC
Start: 2017-09-26 — End: 2017-09-26
  Administered 2017-09-26: 10 mg via INTRAVENOUS
  Filled 2017-09-25: qty 2

## 2017-09-25 NOTE — ED Notes (Addendum)
Patient transported to X-ray 

## 2017-09-25 NOTE — ED Triage Notes (Signed)
Pt presents with concerns for a stroke.  Pt reports high BP x 1 week.  Pt reports that last night he has been shaking and having stuttering in speech.  Pt voices is clear in triage, no facial droop present and no arm drift present.  Pt reports SOB upon exertion.  Pt report headache but has 2 VP shunts.  Pt also report dizziness.

## 2017-09-25 NOTE — ED Notes (Signed)
Patient transported to CT 

## 2017-09-25 NOTE — ED Provider Notes (Signed)
[facility] DEPT Provider Note   CSN: [id number] Arrival date & time: 09/25/17  1854     History   Chief Complaint Chief Complaint  Patient presents with  . Shaking  . Headache  . Stuttering    HPI Eric Houston is a 61 y.o. male.  Eric Houston is a 61 y.o. Male who presents to the emergency department complaining of a headache since yesterday with some associated intermittent slurred speech.  He reports his slurred speech started yesterday.  He reports he does not have any slurred speech right now.  He is concerned that he might have had a stroke because this runs in his family.  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  He reports he is noted that his blood pressure has been high recently.  He reports is been compliant with his blood pressure medications.  He denies any changes to his medications recently.  He reports feeling lightheaded with position change since yesterday.  No syncope.  No room spinning dizziness.  He denies any head injury or trauma.  He is not on anticoagulants.  He denies fevers, neck stiffness, abdominal pain, vomiting, diarrhea, double vision, extremity weakness, chest pain, coughing or shortness of breath.   The history is provided by the patient and medical records. No language interpreter was used.  Headache   Pertinent negatives include no fever, no shortness of breath, no nausea and no vomiting.    Past Medical History:  Diagnosis Date  . Arthritis   . Asthmatic bronchitis   . COPD (chronic obstructive pulmonary disease) ([place])   . Diabetes mellitus type II   . Headache   . Hydrocephalus    vp shunt  . Hypertension   . Shortness of breath dyspnea   . Sleep apnea     Patient Active Problem List   Diagnosis Date Noted  . Stevens-Johnson syndrome involving 30 to 39 percent of total body surface area ([place]) 08/12/2016  . Conjunctivitis of both eyes 08/09/2016  . Rash 08/09/2016  . Sore throat  08/09/2016  . Viral illness 08/09/2016  . Dehydration 08/09/2016  . Thrombocytopenia ([place]) 08/09/2016  . Bilateral lower extremity edema 08/09/2016  . COPD (chronic obstructive pulmonary disease) ([place])   . Severe obesity (BMI >= 40) ([place]) 11/06/2015  . Type 2 diabetes mellitus with hyperglycemia, without long-term current use of insulin ([place])   . Dyspnea 09/24/2015  . Asthma 09/24/2015  . Well controlled diabetes mellitus ([place])   . Essential hypertension   . Diabetes mellitus, type 2 ([place])   . Asthmatic bronchitis   . Sleep apnea   . Hydrocephalus   . PLANTAR FASCIITIS, LEFT 11/28/2008  . HEEL PAIN, LEFT 11/28/2008  . PES PLANUS 11/28/2008  . SHOULDER PAIN, LEFT 10/03/2008  . KNEE PAIN, BILATERAL 10/03/2008    Past Surgical History:  Procedure Laterality Date  . neck fusion    . TONSILLECTOMY    . VENTRICULOPERITONEAL SHUNT     x 2       Home Medications    Prior to Admission medications   Medication Sig Start Date End Date Taking? Authorizing Provider  albuterol (VENTOLIN HFA) 108 (90 BASE) MCG/ACT inhaler Inhale 2 puffs into the lungs every 4 (four) hours as needed.    Yes [provider]  atorvastatin (LIPITOR) 20 MG tablet Take 1 tablet (20 mg total) by mouth daily. 07/10/16 09/25/17 Yes End, Harrell Gave, MD  buPROPion [facility] SR) 100 MG 12 hr tablet  Take 100 mg by mouth daily. 07/29/17  Yes [provider]  guaiFENesin-dextromethorphan (ROBITUSSIN DM) 100-10 MG/5ML syrup Take 5 mLs by mouth every 4 (four) hours as needed for cough. 09/26/15  Yes Barton Dubois, MD  losartan (COZAAR) 50 MG tablet Take 50 mg by mouth daily. 07/25/16  Yes [provider]  metFORMIN (GLUCOPHAGE) 500 MG tablet Take 500 mg by mouth daily after breakfast. 08/28/16  Yes [provider]  oxyCODONE-acetaminophen (PERCOCET) 10-325 MG tablet Take 10-325 tablets by mouth every 8 (eight) hours as needed for pain. 06/30/16  Yes [provider]    sertraline (ZOLOFT) 100 MG tablet Take 100 mg by mouth daily. 07/29/17  Yes [provider]  budesonide-formoterol (SYMBICORT) 160-4.5 MCG/ACT inhaler Inhale 2 puffs into the lungs 2 (two) times daily. Patient not taking: Reported on 09/25/2017 09/26/15   Barton Dubois, MD  insulin aspart (NOVOLOG) 100 UNIT/ML injection Inject 0-5 Units into the skin at bedtime. Patient not taking: Reported on 09/25/2017 08/12/16   Geradine Girt, DO  insulin aspart (NOVOLOG) 100 UNIT/ML injection Inject 0-9 Units into the skin 3 (three) times daily with meals. Patient not taking: Reported on 09/25/2017 08/12/16   Geradine Girt, DO    Family History Family History  Problem Relation Age of Onset  . Emphysema Father        smoked  . Diabetes Father   . Heart disease Father   . Heart attack Father   . Cancer Mother        pancreatic  . Diabetes Mother   . Heart disease Mother        Born with hole in the heart  . Stroke Mother   . Diabetes Brother     Social History Social History   Tobacco Use  . Smoking status: Former Smoker    Packs/day: 2.00    Years: 2.00    Pack years: 4.00    Types: Cigarettes    Last attempt to quit: 11/14/1984    Years since quitting: 32.8  . Smokeless tobacco: Never Used  Substance Use Topics  . Alcohol use: Yes    Alcohol/week: 2.4 oz    Types: 4 Shots of liquor per week    Comment: mixed drinks on the weekend  . Drug use: No     Allergies   Allopurinol and Sulfa antibiotics   Review of Systems Review of Systems  Constitutional: Negative for chills and fever.  HENT: Negative for congestion and sore throat.   Eyes: Negative for visual disturbance.  Respiratory: Negative for cough and shortness of breath.   Cardiovascular: Negative for chest pain.  Gastrointestinal: Negative for abdominal pain, diarrhea, nausea and vomiting.  Genitourinary: Negative for dysuria.  Musculoskeletal: Negative for back pain and neck pain.  Skin: Negative for  rash.  Neurological: Positive for light-headedness and headaches. Negative for dizziness, syncope, weakness and numbness.     Physical Exam Updated Vital Signs BP (!) 176/144   Pulse 84   Temp 97.9 F (36.6 C)   Resp 20   Ht 5' 9.5" (1.765 m)   Wt 131.5 kg (290 lb)   SpO2 95%   BMI 42.21 kg/m   Physical Exam  Constitutional: He is oriented to person, place, and time. He appears well-developed and well-nourished.  Non-toxic appearance. He does not appear ill. No distress.  Nontoxic appearing.  Obese male.  HENT:  Head: Normocephalic and atraumatic.  Mouth/Throat: Oropharynx is clear and moist.  Eyes: Conjunctivae and EOM are  normal. Pupils are equal, round, and reactive to light. Right eye exhibits no discharge. Left eye exhibits no discharge.  Neck: Normal range of motion. Neck supple. No neck rigidity.  Cardiovascular: Normal rate, regular rhythm, normal heart sounds and intact distal pulses. Exam reveals no gallop and no friction rub.  No murmur heard. Pulmonary/Chest: Effort normal and breath sounds normal. No respiratory distress. He has no wheezes. He has no rales.  Abdominal: Soft. There is no tenderness.  Musculoskeletal: He exhibits no edema.  Lymphadenopathy:    He has no cervical adenopathy.  Neurological: He is alert and oriented to person, place, and time. He has normal strength. He is not disoriented. No cranial nerve deficit or sensory deficit. Coordination normal. GCS eye subscore is 4. GCS verbal subscore is 5. GCS motor subscore is 6.  Patient is alert and oriented 3. Speech is clear and coherent. Cranial nerves are intact. Sensation and strength is intact to bilateral upper and lower extremities. No pronator drift.  Finger to nose intact bilaterally.  No facial droop.  Vision is grossly intact.  Skin: Skin is warm and dry. No rash noted. He is not diaphoretic. No erythema. No pallor.  Psychiatric: He has a normal mood and affect. His behavior is normal.    Nursing note and vitals reviewed.    ED Treatments / Results  Labs (all labs ordered are listed, but only abnormal results are displayed) Labs Reviewed  DIFFERENTIAL - Abnormal; Notable for the following components:      Result Value   Monocytes Absolute 1.4 (*)    All other components within normal limits  URINALYSIS, ROUTINE W REFLEX MICROSCOPIC - Abnormal; Notable for the following components:   Hgb urine dipstick SMALL (*)    Protein, ur 100 (*)    Squamous Epithelial / LPF 0-5 (*)    All other components within normal limits  ETHANOL  PROTIME-INR  APTT  CBC  COMPREHENSIVE METABOLIC PANEL  RAPID URINE DRUG SCREEN, HOSP PERFORMED  I-STAT CHEM 8, ED  I-STAT TROPONIN, ED    EKG  EKG Interpretation  Date/Time:  Saturday September 25 2017 20:45:53 EST Ventricular Rate:  69 PR Interval:  178 QRS Duration: 90 QT Interval:  380 QTC Calculation: 407 R Axis:   75 Text Interpretation:  Normal sinus rhythm Normal ECG Confirmed by Lajean Saver [phone]) on 09/25/2017 9:01:53 PM       Radiology Dg Skull 1-3 Views  Result Date: 09/25/2017 CLINICAL DATA:  Evaluate for shunt catheter breakage EXAM: SKULL - 1-3 VIEW COMPARISON:  06/21/2012 FINDINGS: Right-sided ventricular shunt catheter is noted. Catheter appears intact as visualized. An old left-sided shunt catheter is noted as well. These changes are stable from the prior exam. Postsurgical changes are noted in the cervical spine. IMPRESSION: Stable appearing right-sided ventricular shunt catheter. Electronically Signed   By: Inez Catalina M.D.   On: 09/25/2017 20:50   Dg Chest 1 View  Result Date: 09/25/2017 CLINICAL DATA:  Check shunt catheter integrity EXAM: CHEST 1 VIEW COMPARISON:  08/09/2016 FINDINGS: Right-sided shunt catheter is noted along the anterior chest wall. No discontinuity is seen. Cardiac shadow is mildly enlarged but accentuated by frontal technique. The lungs are clear. No acute bony abnormality is noted.  IMPRESSION: Intact right-sided ventricular shunt catheter Electronically Signed   By: Inez Catalina M.D.   On: 09/25/2017 20:51   Dg Abd 1 View  Result Date: 09/25/2017 CLINICAL DATA:  Check for shunt catheter integrity EXAM: ABDOMEN - 1 VIEW COMPARISON:  None. FINDINGS: Scattered large and small bowel gas is noted. Ventricular shunt catheter is noted within the right upper quadrant. No acute abnormality is noted. IMPRESSION: Intact shunt catheter in the right upper quadrant. Electronically Signed   By: Inez Catalina M.D.   On: 09/25/2017 20:51   Ct Head Wo Contrast  Result Date: 09/25/2017 CLINICAL DATA:  61 year old male with chronic dizziness. Ventricular shunt. EXAM: CT HEAD WITHOUT CONTRAST TECHNIQUE: Contiguous axial images were obtained from the base of the skull through the vertex without intravenous contrast. COMPARISON:  Head CT dated 06/21/2012 FINDINGS: Brain: Bilateral posterior parietal ventriculostomy shunts are in similar position. There is stable dilatation of the lateral ventricles as seen on the prior CT. 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 There is no acute intracranial hemorrhage. Periventricular and deep white matter chronic microvascular ischemic changes. Vascular: No hyperdense vessel or unexpected calcification. Skull: No acute calvarial pathology. Sinuses/Orbits: No acute finding. Other: None IMPRESSION: No acute intracranial hemorrhage. No interval change in the ventricular dilatation or shunt position compared to the prior CT of 06/21/2012. Electronically Signed   By: Anner Crete M.D.   On: 09/25/2017 21:27    Procedures Procedures (including critical care time)  Medications Ordered in ED Medications  hydrALAZINE (APRESOLINE) injection 10 mg (10 mg Intravenous Given 09/25/17 2143)  metoCLOPramide (REGLAN) injection 10 mg (10 mg Intravenous Given 09/26/17 0008)  diphenhydrAMINE (BENADRYL) injection 25 mg (25 mg Intravenous Given  09/26/17 0008)     Initial Impression / Assessment and Plan / ED Course  I have reviewed the triage vital signs and the nursing notes.  Pertinent labs & imaging results that were available during my care of the patient were reviewed by me and considered in my medical decision making (see chart for details).    This  is a 62 y.o. Male who presents to the emergency department complaining of a headache since yesterday with some associated intermittent slurred speech.  He reports his slurred speech started yesterday.  He reports he does not have any slurred speech right now.  He is concerned that he might have had a stroke because this runs in his family.  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  He reports he is noted that his blood pressure has been high recently.  He reports is been compliant with his blood pressure medications.  He denies any changes to his medications recently.  He reports feeling lightheaded with position change since yesterday.  No syncope.  No room spinning dizziness.  He denies any head injury or trauma.  He is not on anticoagulants.  On exam the patient is afebrile nontoxic-appearing.  He has no focal neurological deficits.  He is very hypertensive on arrival to the emergency department.  He tells me he has been compliant with his blood pressure medications. EKG shows normal sinus rhythm.  Troponin is not elevated.  Ethanol is undetectable.  PT/INR is normal.  CBC is unremarkable.  No leukocytosis.  CMP is within normal limits.  Urine drug screen is unremarkable.  Urinalysis without sign of infection. Shunt series is unremarkable.  CT head without contrast shows no acute intracranial hemorrhage.  No interval change in the ventricular dilation or shunt position compared to the prior CT in 2013. At reevaluation patient's blood pressure has improved some after hydralazine.  He reports feeling better after headache cocktail with Reglan and Benadryl.  Will discharge  with close follow-up by his primary care doctor to recheck  his blood pressure.  I encouraged him to continue taking his blood pressure medications.  I also provide him with follow-up with [facility] neurology.  I have not witnessed any stuttering or slurring of his speech.  Neither has nursing staff.  Workup here is reassuring.  Will discharge with close follow-up by PCP and neurology.  Return precautions discussed. I advised the patient to follow-up with their primary care provider this week. I advised the patient to return to the emergency department with new or worsening symptoms or new concerns. The patient verbalized understanding and agreement with plan.    This patient was discussed with and evaluated by Dr. Ashok Cordia who agrees with assessment and plan.   Final Clinical Impressions(s) / ED Diagnoses   Final diagnoses:  Bad headache  Essential hypertension  S/P VP shunt    ED Discharge Orders    None       Waynetta Pean, PA-C 09/26/17 0045    Lajean Saver, MD 09/26/17 [phone]

## 2018-01-01 ENCOUNTER — Other Ambulatory Visit: Payer: Self-pay | Admitting: Internal Medicine

## 2018-01-03 NOTE — Telephone Encounter (Signed)
Pt's pharmacy is requesting a refill on atorvastatin. Would Dr. Saunders Revel like to refill this medication? Please advise

## 2020-01-26 ENCOUNTER — Emergency Department (HOSPITAL_COMMUNITY)
Admission: EM | Admit: 2020-01-26 | Discharge: 2020-01-26 | Disposition: A | Discharge status: Home / Self Care | Payer: Medicare HMO | Attending: Emergency Medicine | Admitting: Emergency Medicine

## 2020-01-26 ENCOUNTER — Other Ambulatory Visit: Payer: Self-pay

## 2020-01-26 ENCOUNTER — Emergency Department (HOSPITAL_COMMUNITY): Payer: Medicare HMO

## 2020-01-26 DIAGNOSIS — J449 Chronic obstructive pulmonary disease, unspecified: Secondary | ICD-10-CM | POA: Diagnosis not present

## 2020-01-26 DIAGNOSIS — E119 Type 2 diabetes mellitus without complications: Secondary | ICD-10-CM | POA: Diagnosis not present

## 2020-01-26 DIAGNOSIS — Z862 Personal history of diseases of the blood and blood-forming organs and certain disorders involving the immune mechanism: Secondary | ICD-10-CM | POA: Diagnosis not present

## 2020-01-26 DIAGNOSIS — R531 Weakness: Secondary | ICD-10-CM | POA: Diagnosis present

## 2020-01-26 DIAGNOSIS — I10 Essential (primary) hypertension: Secondary | ICD-10-CM | POA: Insufficient documentation

## 2020-01-26 DIAGNOSIS — Z79899 Other long term (current) drug therapy: Secondary | ICD-10-CM | POA: Insufficient documentation

## 2020-01-26 DIAGNOSIS — Z87891 Personal history of nicotine dependence: Secondary | ICD-10-CM | POA: Diagnosis not present

## 2020-01-26 DIAGNOSIS — Z7984 Long term (current) use of oral hypoglycemic drugs: Secondary | ICD-10-CM | POA: Insufficient documentation

## 2020-01-26 LAB — COMPREHENSIVE METABOLIC PANEL
ALT: 16 U/L (ref 0–44)
AST: 25 U/L (ref 15–41)
Albumin: 2.6 g/dL — ABNORMAL LOW (ref 3.5–5.0)
Alkaline Phosphatase: 230 U/L — ABNORMAL HIGH (ref 38–126)
Anion gap: 14 (ref 5–15)
BUN: 34 mg/dL — ABNORMAL HIGH (ref 8–23)
CO2: 20 mmol/L — ABNORMAL LOW (ref 22–32)
Calcium: 12.1 mg/dL — ABNORMAL HIGH (ref 8.9–10.3)
Chloride: 102 mmol/L (ref 98–111)
Creatinine, Ser: 1.13 mg/dL (ref 0.61–1.24)
GFR calc Af Amer: >60 mL/min (ref >60)
GFR calc non Af Amer: >60 mL/min (ref >60)
Glucose, Bld: 101 mg/dL — ABNORMAL HIGH (ref 70–99)
Potassium: 4.2 mmol/L (ref 3.5–5.1)
Sodium: 136 mmol/L (ref 135–145)
Total Bilirubin: 1.1 mg/dL (ref 0.3–1.2)
Total Protein: 6 g/dL — ABNORMAL LOW (ref 6.5–8.1)

## 2020-01-26 LAB — CBC WITH DIFFERENTIAL/PLATELET
Abs Immature Granulocytes: 0.06 10*3/uL (ref 0.00–0.07)
Basophils Absolute: 0.1 10*3/uL (ref 0.0–0.1)
Basophils Relative: 1 %
Eosinophils Absolute: 0 10*3/uL (ref 0.0–0.5)
Eosinophils Relative: 0 %
HCT: 27.5 % — ABNORMAL LOW (ref 39.0–52.0)
Hemoglobin: 9.2 g/dL — ABNORMAL LOW (ref 13.0–17.0)
Immature Granulocytes: 1 %
Lymphocytes Relative: 9 %
Lymphs Abs: 0.8 10*3/uL (ref 0.7–4.0)
MCH: 30.2 pg (ref 26.0–34.0)
MCHC: 33.5 g/dL (ref 30.0–36.0)
MCV: 90.2 fL (ref 80.0–100.0)
Monocytes Absolute: 1.4 10*3/uL — ABNORMAL HIGH (ref 0.1–1.0)
Monocytes Relative: 15 %
Neutro Abs: 7 10*3/uL (ref 1.7–7.7)
Neutrophils Relative %: 74 %
Platelets: 307 10*3/uL (ref 150–400)
RBC: 3.05 MIL/uL — ABNORMAL LOW (ref 4.22–5.81)
RDW: 16.9 % — ABNORMAL HIGH (ref 11.5–15.5)
WBC: 9.3 10*3/uL (ref 4.0–10.5)
nRBC: 0 % (ref 0.0–0.2)

## 2020-01-26 LAB — URINALYSIS, ROUTINE W REFLEX MICROSCOPIC
Bilirubin Urine: NEGATIVE
Glucose, UA: NEGATIVE mg/dL
Hgb urine dipstick: NEGATIVE
Ketones, ur: NEGATIVE mg/dL
Leukocytes,Ua: NEGATIVE
Nitrite: NEGATIVE
Protein, ur: NEGATIVE mg/dL
Specific Gravity, Urine: 1.015 (ref 1.005–1.030)
pH: 5 (ref 5.0–8.0)

## 2020-01-26 LAB — POC OCCULT BLOOD, ED: Fecal Occult Bld: NEGATIVE

## 2020-01-26 LAB — PROTIME-INR
INR: 1.2 (ref 0.8–1.2)
Prothrombin Time: 14.6 seconds (ref 11.4–15.2)

## 2020-01-26 NOTE — Discharge Instructions (Signed)
Your laboratory results were within normal limits today aside from your hemoglobin which was 9.3, this will likely need to be rechecked within 1 week by your primary care physician.  If you experience any worsening symptoms please return to emergency room.

## 2020-01-26 NOTE — ED Notes (Signed)
Urine culture sent as save tube to lab with urine

## 2020-01-26 NOTE — ED Provider Notes (Signed)
[facility] EMERGENCY DEPARTMENT Provider Note   CSN: [id number] Arrival date & time: 01/26/20  1420     History Chief Complaint  Patient presents with  . Fall  . Weakness    x 1 pass    Eric Houston is a 64 y.o. male.  64 y.o male with a PMH of COPD, diabetes, hydrocephalus, hypertension presents to the ED with complaints of weakness for 1 month.  Patient reports he attempted to stand today, states he is unable to stand for greater than 10 minutes, states he fell this morning, states he usually ambulates with a walker however was unable to make it from the step of his car to his house with his walker.  He reports a couple of weeks ago, had blood drawn by his PCP.  States that he felt like he passed out on the car and had another fall.  EMS evaluated him and diagnosed him with a concussion?.  Patient feels that his legs are somewhat swollen, states he has been feeling weaker than his usual.  No chest pain, shortness of breath, fevers, abdominal pain or headaches.  The history is provided by the patient and medical records.  Fall Pertinent negatives include no chest pain, no abdominal pain, no headaches and no shortness of breath.  Weakness Associated symptoms: no abdominal pain, no chest pain, no diarrhea, no fever, no headaches, no nausea, no shortness of breath and no vomiting        Past Medical History:  Diagnosis Date  . Arthritis   . Asthmatic bronchitis   . COPD (chronic obstructive pulmonary disease) ([place])   . Diabetes mellitus type II   . Headache   . Hydrocephalus    vp shunt  . Hypertension   . Shortness of breath dyspnea   . Sleep apnea     Patient Active Problem List   Diagnosis Date Noted  . Stevens-Johnson syndrome involving 30 to 39 percent of total body surface area ([place]) 08/12/2016  . Conjunctivitis of both eyes 08/09/2016  . Rash 08/09/2016  . Sore throat 08/09/2016  . Viral illness 08/09/2016  . Dehydration 08/09/2016  .  Thrombocytopenia ([place]) 08/09/2016  . Bilateral lower extremity edema 08/09/2016  . COPD (chronic obstructive pulmonary disease) ([place])   . Severe obesity (BMI >= 40) ([place]) 11/06/2015  . Type 2 diabetes mellitus with hyperglycemia, without long-term current use of insulin ([place])   . Dyspnea 09/24/2015  . Asthma 09/24/2015  . Well controlled diabetes mellitus ([place])   . Essential hypertension   . Diabetes mellitus, type 2 ([place])   . Asthmatic bronchitis   . Sleep apnea   . Hydrocephalus ([place])   . PLANTAR FASCIITIS, LEFT 11/28/2008  . HEEL PAIN, LEFT 11/28/2008  . PES PLANUS 11/28/2008  . SHOULDER PAIN, LEFT 10/03/2008  . KNEE PAIN, BILATERAL 10/03/2008    Past Surgical History:  Procedure Laterality Date  . neck fusion    . TONSILLECTOMY    . VENTRICULOPERITONEAL SHUNT     x 2       Family History  Problem Relation Age of Onset  . Emphysema Father        smoked  . Diabetes Father   . Heart disease Father   . Heart attack Father   . Cancer Mother        pancreatic  . Diabetes Mother   . Heart disease Mother        Born with hole in the heart  . Stroke  Mother   . Diabetes Brother     Social History   Tobacco Use  . Smoking status: Former Smoker    Packs/day: 2.00    Years: 2.00    Pack years: 4.00    Types: Cigarettes    Quit date: 11/14/1984    Years since quitting: 35.2  . Smokeless tobacco: Never Used  Substance Use Topics  . Alcohol use: Yes    Alcohol/week: 4.0 standard drinks    Types: 4 Shots of liquor per week    Comment: mixed drinks on the weekend  . Drug use: No    Home Medications Prior to Admission medications   Medication Sig Start Date End Date Taking? Authorizing Provider  albuterol (VENTOLIN HFA) 108 (90 BASE) MCG/ACT inhaler Inhale 2 puffs into the lungs every 4 (four) hours as needed.    Yes [provider]  atorvastatin (LIPITOR) 20 MG tablet Take 1 tablet (20 mg total) by mouth daily. 07/10/16 01/26/20 Yes End, Harrell Gave, MD    losartan (COZAAR) 50 MG tablet Take 50 mg by mouth daily. 07/25/16  Yes [provider]  metFORMIN (GLUCOPHAGE) 500 MG tablet Take 500 mg by mouth daily after breakfast. 08/28/16  Yes [provider]  potassium chloride SA (KLOR-CON) 20 MEQ tablet Take 20 mEq by mouth daily. 11/27/19  Yes [provider]  budesonide-formoterol (SYMBICORT) 160-4.5 MCG/ACT inhaler Inhale 2 puffs into the lungs 2 (two) times daily. Patient not taking: Reported on 09/25/2017 09/26/15   Barton Dubois, MD  buPROPion [facility] SR) 100 MG 12 hr tablet Take 100 mg by mouth daily. 07/29/17   [provider]  guaiFENesin-dextromethorphan (ROBITUSSIN DM) 100-10 MG/5ML syrup Take 5 mLs by mouth every 4 (four) hours as needed for cough. 09/26/15   Barton Dubois, MD  insulin aspart (NOVOLOG) 100 UNIT/ML injection Inject 0-5 Units into the skin at bedtime. Patient not taking: Reported on 09/25/2017 08/12/16   Geradine Girt, DO  insulin aspart (NOVOLOG) 100 UNIT/ML injection Inject 0-9 Units into the skin 3 (three) times daily with meals. Patient not taking: Reported on 09/25/2017 08/12/16   Geradine Girt, DO  oxyCODONE-acetaminophen (PERCOCET) 10-325 MG tablet Take 10-325 tablets by mouth every 8 (eight) hours as needed for pain. 06/30/16   [provider]  sertraline (ZOLOFT) 100 MG tablet Take 100 mg by mouth daily. 07/29/17   [provider]    Allergies    Allopurinol and Sulfa antibiotics  Review of Systems   Review of Systems  Constitutional: Negative for chills and fever.  HENT: Negative for sore throat.   Respiratory: Negative for shortness of breath.   Cardiovascular: Negative for chest pain.  Gastrointestinal: Negative for abdominal pain, diarrhea, nausea and vomiting.  Genitourinary: Negative for flank pain.  Neurological: Positive for syncope and weakness. Negative for light-headedness and headaches.  All other systems reviewed and are  negative.   Physical Exam Updated Vital Signs BP 116/65   Pulse 84   Temp 97.9 F (36.6 C) (Oral)   Resp 16   Wt 108.9 kg   SpO2 99%   BMI 34.93 kg/m   Physical Exam Vitals and nursing note reviewed. Exam conducted with a chaperone present.  Constitutional:      Appearance: Normal appearance.  HENT:     Head: Normocephalic.     Comments: Abrasion noted to the parietal part of his head.    Nose: Nose normal.     Mouth/Throat:     Mouth: Mucous membranes are moist.  Eyes:     Pupils: Pupils are equal, round, and reactive to light.  Cardiovascular:     Rate and Rhythm: Normal rate.  Pulmonary:     Effort: Pulmonary effort is normal.     Breath sounds: No wheezing or rales.     Comments: Lungs s are clear to auscultation without any wheezing, rhonchi, rales. Abdominal:     General: Abdomen is flat.     Tenderness: There is no abdominal tenderness. There is no right CVA tenderness or left CVA tenderness.     Comments: Soft, nontender to palpation.  Genitourinary:    Rectum: Guaiac result negative. No tenderness, external hemorrhoid or internal hemorrhoid.     Comments: No visible gross blood on my exam.  Chaperoned by RN [place]. Musculoskeletal:     Cervical back: Normal range of motion and neck supple.  Skin:    General: Skin is warm and dry.  Neurological:     Mental Status: He is alert and oriented to person, place, and time.     ED Results / Procedures / Treatments   Labs (all labs ordered are listed, but only abnormal results are displayed) Labs Reviewed  CBC WITH DIFFERENTIAL/PLATELET - Abnormal; Notable for the following components:      Result Value   RBC 3.05 (*)    Hemoglobin 9.2 (*)    HCT 27.5 (*)    RDW 16.9 (*)    Monocytes Absolute 1.4 (*)    All other components within normal limits  COMPREHENSIVE METABOLIC PANEL - Abnormal; Notable for the following components:   CO2 20 (*)    Glucose, Bld 101 (*)    BUN 34 (*)    Calcium 12.1 (*)     Total Protein 6.0 (*)    Albumin 2.6 (*)    Alkaline Phosphatase 230 (*)    All other components within normal limits  URINALYSIS, ROUTINE W REFLEX MICROSCOPIC  PROTIME-INR  POC OCCULT BLOOD, ED    EKG EKG Interpretation  Date/Time:  Friday January 26 2020 14:31:51 EDT Ventricular Rate:  76 PR Interval:    QRS Duration: 98 QT Interval:  345 QTC Calculation: 388 R Axis:   83 Text Interpretation: Sinus rhythm Borderline right axis deviation no wpw prolonged qt or brugada Otherwise no significant change Confirmed by Deno Etienne [phone]) on 01/26/2020 2:38:27 PM   Radiology CT Head Wo Contrast  Result Date: 01/26/2020 CLINICAL DATA:  Headache, acute, normal neuro exam. Headache after fall. EXAM: CT HEAD WITHOUT CONTRAST TECHNIQUE: Contiguous axial images were obtained from the base of the skull through the vertex without intravenous contrast. 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 Stable position of bilateral parietal approach ventricular catheters terminating within the lateral ventricles. The ventricular system has not significantly changed in size or configuration as compared to prior examination 09/25/2017. Unchanged small focus of encephalomalacia within the right frontal lobe, possibly reflecting a prior catheter tract. There is no evidence of acute intracranial hemorrhage. No evidence of intracranial mass. No midline shift or extra-axial fluid collection. Vascular: No hyperdense vessel. Skull: Normal. Negative for fracture or focal lesion. Sinuses/Orbits: Visualized orbits show no acute finding. No significant paranasal sinus disease or mastoid effusion. IMPRESSION: 1. No evidence of acute intracranial abnormality. 2. 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 Unchanged position of bilateral parietal approach ventricular  catheters. Unchanged size and configuration of the ventricular system  as compared to head CT 09/25/2017. 3. Additional chronic findings without interval change as described. Electronically Signed   By: Kellie Simmering DO   On: 01/26/2020 17:22    Procedures Procedures (including critical care time)  Medications Ordered in ED Medications - No data to display  ED Course  I have reviewed the triage vital signs and the nursing notes.  Pertinent labs & imaging results that were available during my care of the patient were reviewed by me and considered in my medical decision making (see chart for details).    MDM Rules/Calculators/A&P   Patient with past medical history of hypertension, diabetes presents to the ED with complaints of weakness for the past month.  Reports he has multiple falls, states he lost consciousness during his fall.  He states that he is usually ambulatory with a walker, reports he has felt weaker with ambulation.  No recent fevers, chills, chest pain, shortness of breath, dizziness.  During evaluation patient appears disheveled, there is no abrasion noted to the anterior aspect of his head, no other visible trauma.  No pain with palpation of the chest, lungs are clear to auscultation, oxygen saturation is 99% on room air he is afebrile.  Abdomen is soft, nontender to palpation.  There is bilateral leg swelling, does have a prior history of edema, reports that his legs usually feel very heavy.  Been no recent weight gains.  Interpretation of his labs showed a CBC with no leukocytosis, hemoglobin slightly decreased however he has not had labs in over 3 years.  A rectal exam was performed by me with nurse [place] at the bedside, grossly negative on my exam.  This was also negative via lab.  CMP without any electrolyte derangement, BUN is slightly elevated at 34, creatinine level is within normal limits.  LFTs are unremarkable.  UA without any signs of leukocytes, nitrites that could  be contributing to his weakness.  PT and INR within normal limits.  EKG is without any signs of infarct or STEMI denies any chest pain, shortness of breath, no tachycardia on his ED visit.  CT of his head was obtained due to loss of consciousness.  He is neurologically intact on today's evaluation. 1. No evidence of acute intracranial abnormality.  2. Redemonstrated agenesis of the corpus callosum with  interhemispheric cyst. Unchanged position of bilateral parietal  approach ventricular catheters. Unchanged size and configuration of  the ventricular system as compared to head CT 09/25/2017.  3. Additional chronic findings without interval change as described.       Results of his evaluation were discussed with patient at length, he does report improvement in his symptoms since arrival to the ED.  I advised that he have his hemoglobin rechecked within 1 week for trending.  Patient is agreeable of this at this time, return precautions were discussed at length.  Portions of this note were generated with [employer]. Dictation errors may occur despite best attempts at proofreading.  Final Clinical Impression(s) / ED Diagnoses Final diagnoses:  Generalized weakness    Rx / DC Orders ED Discharge Orders    None       Janeece Fitting, Hershal Coria 01/26/20 2032    Deno Etienne, DO 01/27/20 1200

## 2020-01-26 NOTE — ED Triage Notes (Signed)
PT here from home via [facility].  States weakness x 1 month and his legs gave out today.  He fell backward and hit the back of his head.  Also fell 1 week prior.  No loc.  Abrasions to back of head.  Pt ao x 4.  Has 2 shunts placed as child for hydrocephally.  States weakness increasing gradually over 1 month.  Pitting edema, recently removed lasix and placed on hctz.  bp 123/74 p 80 rr 20 96% RA CBG 111

## 2020-01-26 NOTE — ED Notes (Signed)
Patient verbalizes understanding of discharge instructions. Opportunity for questioning and answers were provided. Armband removed by staff, pt discharged from ED to home with son 

## 2020-02-21 ENCOUNTER — Encounter (HOSPITAL_COMMUNITY): Payer: Medicare HMO

## 2020-02-21 ENCOUNTER — Emergency Department (HOSPITAL_COMMUNITY): Payer: Medicare HMO

## 2020-02-21 ENCOUNTER — Other Ambulatory Visit: Payer: Self-pay

## 2020-02-21 ENCOUNTER — Inpatient Hospital Stay (HOSPITAL_COMMUNITY)
Admission: EM | Admit: 2020-02-21 | Discharge: 2020-02-27 | DRG: 682 | Disposition: E | Payer: Medicare HMO | Attending: Critical Care Medicine | Admitting: Critical Care Medicine

## 2020-02-21 ENCOUNTER — Encounter (HOSPITAL_COMMUNITY): Payer: Self-pay | Admitting: Emergency Medicine

## 2020-02-21 ENCOUNTER — Inpatient Hospital Stay (HOSPITAL_COMMUNITY): Payer: Medicare HMO

## 2020-02-21 DIAGNOSIS — J9601 Acute respiratory failure with hypoxia: Secondary | ICD-10-CM | POA: Diagnosis present

## 2020-02-21 DIAGNOSIS — D649 Anemia, unspecified: Secondary | ICD-10-CM | POA: Diagnosis present

## 2020-02-21 DIAGNOSIS — E861 Hypovolemia: Secondary | ICD-10-CM | POA: Diagnosis not present

## 2020-02-21 DIAGNOSIS — R42 Dizziness and giddiness: Secondary | ICD-10-CM | POA: Diagnosis present

## 2020-02-21 DIAGNOSIS — Z823 Family history of stroke: Secondary | ICD-10-CM

## 2020-02-21 DIAGNOSIS — E669 Obesity, unspecified: Secondary | ICD-10-CM | POA: Diagnosis present

## 2020-02-21 DIAGNOSIS — Z66 Do not resuscitate: Secondary | ICD-10-CM | POA: Diagnosis not present

## 2020-02-21 DIAGNOSIS — Z800 Family history of malignant neoplasm of digestive organs: Secondary | ICD-10-CM

## 2020-02-21 DIAGNOSIS — Z6835 Body mass index (BMI) 35.0-35.9, adult: Secondary | ICD-10-CM | POA: Diagnosis not present

## 2020-02-21 DIAGNOSIS — N170 Acute kidney failure with tubular necrosis: Secondary | ICD-10-CM | POA: Diagnosis present

## 2020-02-21 DIAGNOSIS — Z794 Long term (current) use of insulin: Secondary | ICD-10-CM

## 2020-02-21 DIAGNOSIS — E875 Hyperkalemia: Secondary | ICD-10-CM | POA: Diagnosis present

## 2020-02-21 DIAGNOSIS — Z20822 Contact with and (suspected) exposure to covid-19: Secondary | ICD-10-CM | POA: Diagnosis present

## 2020-02-21 DIAGNOSIS — R578 Other shock: Secondary | ICD-10-CM | POA: Diagnosis present

## 2020-02-21 DIAGNOSIS — Z515 Encounter for palliative care: Secondary | ICD-10-CM | POA: Diagnosis not present

## 2020-02-21 DIAGNOSIS — W19XXXA Unspecified fall, initial encounter: Secondary | ICD-10-CM | POA: Diagnosis present

## 2020-02-21 DIAGNOSIS — D72829 Elevated white blood cell count, unspecified: Secondary | ICD-10-CM | POA: Diagnosis present

## 2020-02-21 DIAGNOSIS — K7200 Acute and subacute hepatic failure without coma: Secondary | ICD-10-CM | POA: Diagnosis not present

## 2020-02-21 DIAGNOSIS — G92 Toxic encephalopathy: Secondary | ICD-10-CM | POA: Diagnosis present

## 2020-02-21 DIAGNOSIS — Z9911 Dependence on respirator [ventilator] status: Secondary | ICD-10-CM | POA: Diagnosis not present

## 2020-02-21 DIAGNOSIS — Z982 Presence of cerebrospinal fluid drainage device: Secondary | ICD-10-CM

## 2020-02-21 DIAGNOSIS — R519 Headache, unspecified: Secondary | ICD-10-CM | POA: Diagnosis present

## 2020-02-21 DIAGNOSIS — R609 Edema, unspecified: Secondary | ICD-10-CM | POA: Diagnosis not present

## 2020-02-21 DIAGNOSIS — R404 Transient alteration of awareness: Secondary | ICD-10-CM | POA: Diagnosis not present

## 2020-02-21 DIAGNOSIS — E872 Acidosis, unspecified: Secondary | ICD-10-CM

## 2020-02-21 DIAGNOSIS — I10 Essential (primary) hypertension: Secondary | ICD-10-CM | POA: Diagnosis present

## 2020-02-21 DIAGNOSIS — J449 Chronic obstructive pulmonary disease, unspecified: Secondary | ICD-10-CM | POA: Diagnosis present

## 2020-02-21 DIAGNOSIS — E883 Tumor lysis syndrome: Secondary | ICD-10-CM | POA: Diagnosis present

## 2020-02-21 DIAGNOSIS — R188 Other ascites: Secondary | ICD-10-CM | POA: Diagnosis present

## 2020-02-21 DIAGNOSIS — M1990 Unspecified osteoarthritis, unspecified site: Secondary | ICD-10-CM | POA: Diagnosis present

## 2020-02-21 DIAGNOSIS — C801 Malignant (primary) neoplasm, unspecified: Secondary | ICD-10-CM | POA: Diagnosis present

## 2020-02-21 DIAGNOSIS — J9602 Acute respiratory failure with hypercapnia: Secondary | ICD-10-CM | POA: Diagnosis present

## 2020-02-21 DIAGNOSIS — E860 Dehydration: Secondary | ICD-10-CM | POA: Diagnosis present

## 2020-02-21 DIAGNOSIS — R55 Syncope and collapse: Secondary | ICD-10-CM | POA: Diagnosis not present

## 2020-02-21 DIAGNOSIS — N179 Acute kidney failure, unspecified: Secondary | ICD-10-CM | POA: Diagnosis not present

## 2020-02-21 DIAGNOSIS — Z452 Encounter for adjustment and management of vascular access device: Secondary | ICD-10-CM

## 2020-02-21 DIAGNOSIS — R092 Respiratory arrest: Secondary | ICD-10-CM

## 2020-02-21 DIAGNOSIS — C787 Secondary malignant neoplasm of liver and intrahepatic bile duct: Secondary | ICD-10-CM | POA: Diagnosis present

## 2020-02-21 DIAGNOSIS — Z825 Family history of asthma and other chronic lower respiratory diseases: Secondary | ICD-10-CM

## 2020-02-21 DIAGNOSIS — C799 Secondary malignant neoplasm of unspecified site: Secondary | ICD-10-CM

## 2020-02-21 DIAGNOSIS — E11649 Type 2 diabetes mellitus with hypoglycemia without coma: Secondary | ICD-10-CM | POA: Diagnosis not present

## 2020-02-21 DIAGNOSIS — E8809 Other disorders of plasma-protein metabolism, not elsewhere classified: Secondary | ICD-10-CM | POA: Diagnosis present

## 2020-02-21 DIAGNOSIS — G4733 Obstructive sleep apnea (adult) (pediatric): Secondary | ICD-10-CM | POA: Diagnosis present

## 2020-02-21 DIAGNOSIS — R579 Shock, unspecified: Secondary | ICD-10-CM | POA: Diagnosis not present

## 2020-02-21 DIAGNOSIS — Z882 Allergy status to sulfonamides status: Secondary | ICD-10-CM

## 2020-02-21 DIAGNOSIS — Z888 Allergy status to other drugs, medicaments and biological substances status: Secondary | ICD-10-CM

## 2020-02-21 DIAGNOSIS — J9600 Acute respiratory failure, unspecified whether with hypoxia or hypercapnia: Secondary | ICD-10-CM | POA: Diagnosis not present

## 2020-02-21 DIAGNOSIS — R34 Anuria and oliguria: Secondary | ICD-10-CM | POA: Diagnosis present

## 2020-02-21 DIAGNOSIS — L8990 Pressure ulcer of unspecified site, unspecified stage: Secondary | ICD-10-CM | POA: Insufficient documentation

## 2020-02-21 DIAGNOSIS — R000 Tachycardia, unspecified: Secondary | ICD-10-CM | POA: Diagnosis present

## 2020-02-21 DIAGNOSIS — Z7951 Long term (current) use of inhaled steroids: Secondary | ICD-10-CM

## 2020-02-21 DIAGNOSIS — L89152 Pressure ulcer of sacral region, stage 2: Secondary | ICD-10-CM | POA: Diagnosis present

## 2020-02-21 DIAGNOSIS — G919 Hydrocephalus, unspecified: Secondary | ICD-10-CM | POA: Diagnosis present

## 2020-02-21 DIAGNOSIS — E790 Hyperuricemia without signs of inflammatory arthritis and tophaceous disease: Secondary | ICD-10-CM | POA: Diagnosis not present

## 2020-02-21 DIAGNOSIS — C260 Malignant neoplasm of intestinal tract, part unspecified: Secondary | ICD-10-CM | POA: Diagnosis present

## 2020-02-21 DIAGNOSIS — Z8249 Family history of ischemic heart disease and other diseases of the circulatory system: Secondary | ICD-10-CM

## 2020-02-21 DIAGNOSIS — Z833 Family history of diabetes mellitus: Secondary | ICD-10-CM

## 2020-02-21 DIAGNOSIS — Z981 Arthrodesis status: Secondary | ICD-10-CM

## 2020-02-21 DIAGNOSIS — D689 Coagulation defect, unspecified: Secondary | ICD-10-CM | POA: Diagnosis not present

## 2020-02-21 DIAGNOSIS — G9340 Encephalopathy, unspecified: Secondary | ICD-10-CM | POA: Diagnosis not present

## 2020-02-21 DIAGNOSIS — Z79899 Other long term (current) drug therapy: Secondary | ICD-10-CM

## 2020-02-21 DIAGNOSIS — Z87891 Personal history of nicotine dependence: Secondary | ICD-10-CM

## 2020-02-21 DIAGNOSIS — Z9181 History of falling: Secondary | ICD-10-CM

## 2020-02-21 LAB — POCT I-STAT 7, (LYTES, BLD GAS, ICA,H+H)
Acid-base deficit: 22 mmol/L — ABNORMAL HIGH (ref 0.0–2.0)
Acid-base deficit: 21 mmol/L — ABNORMAL HIGH (ref 0.0–2.0)
Bicarbonate: 9.3 mmol/L — ABNORMAL LOW (ref 20.0–28.0)
Bicarbonate: 7.8 mmol/L — ABNORMAL LOW (ref 20.0–28.0)
Calcium, Ion: 1.41 mmol/L — ABNORMAL HIGH (ref 1.15–1.40)
Calcium, Ion: 1.27 mmol/L (ref 1.15–1.40)
HCT: 17 % — ABNORMAL LOW (ref 39.0–52.0)
HCT: 19 % — ABNORMAL LOW (ref 39.0–52.0)
Hemoglobin: 5.8 g/dL — CL (ref 13.0–17.0)
Hemoglobin: 6.5 g/dL — CL (ref 13.0–17.0)
O2 Saturation: 100 %
O2 Saturation: 84 %
Patient temperature: 95.1
Potassium: 3.5 mmol/L (ref 3.5–5.1)
Potassium: 3.8 mmol/L (ref 3.5–5.1)
Sodium: 140 mmol/L (ref 135–145)
Sodium: 140 mmol/L (ref 135–145)
TCO2: 11 mmol/L — ABNORMAL LOW (ref 22–32)
TCO2: 9 mmol/L — ABNORMAL LOW (ref 22–32)
pCO2 arterial: 51.3 mmHg — ABNORMAL HIGH (ref 32.0–48.0)
pCO2 arterial: 27 mmHg — ABNORMAL LOW (ref 32.0–48.0)
pH, Arterial: 6.866 — CL (ref 7.350–7.450)
pH, Arterial: 7.057 — CL (ref 7.350–7.450)
pO2, Arterial: 547 mmHg — ABNORMAL HIGH (ref 83.0–108.0)
pO2, Arterial: 61 mmHg — ABNORMAL LOW (ref 83.0–108.0)

## 2020-02-21 LAB — CBC WITH DIFFERENTIAL/PLATELET
Abs Immature Granulocytes: 1.6 10*3/uL — ABNORMAL HIGH (ref 0.00–0.07)
Basophils Absolute: 0.1 10*3/uL (ref 0.0–0.1)
Basophils Relative: 0 %
Eosinophils Absolute: 0.1 10*3/uL (ref 0.0–0.5)
Eosinophils Relative: 0 %
HCT: 24.1 % — ABNORMAL LOW (ref 39.0–52.0)
Hemoglobin: 6.9 g/dL — CL (ref 13.0–17.0)
Immature Granulocytes: 8 %
Lymphocytes Relative: 10 %
Lymphs Abs: 2.2 10*3/uL (ref 0.7–4.0)
MCH: 29.4 pg (ref 26.0–34.0)
MCHC: 28.6 g/dL — ABNORMAL LOW (ref 30.0–36.0)
MCV: 102.6 fL — ABNORMAL HIGH (ref 80.0–100.0)
Monocytes Absolute: 2.6 10*3/uL — ABNORMAL HIGH (ref 0.1–1.0)
Monocytes Relative: 12 %
Neutro Abs: 14.8 10*3/uL — ABNORMAL HIGH (ref 1.7–7.7)
Neutrophils Relative %: 70 %
Platelets: 286 10*3/uL (ref 150–400)
RBC: 2.35 MIL/uL — ABNORMAL LOW (ref 4.22–5.81)
RDW: 17.5 % — ABNORMAL HIGH (ref 11.5–15.5)
WBC: 21.2 10*3/uL — ABNORMAL HIGH (ref 4.0–10.5)
nRBC: 0 % (ref 0.0–0.2)

## 2020-02-21 LAB — HEPATIC FUNCTION PANEL
ALT: 46 U/L — ABNORMAL HIGH (ref 0–44)
AST: 145 U/L — ABNORMAL HIGH (ref 15–41)
Albumin: 1.9 g/dL — ABNORMAL LOW (ref 3.5–5.0)
Alkaline Phosphatase: 191 U/L — ABNORMAL HIGH (ref 38–126)
Bilirubin, Direct: 0.4 mg/dL — ABNORMAL HIGH (ref 0.0–0.2)
Indirect Bilirubin: 0.5 mg/dL (ref 0.3–0.9)
Total Bilirubin: 0.9 mg/dL (ref 0.3–1.2)
Total Protein: 4.9 g/dL — ABNORMAL LOW (ref 6.5–8.1)

## 2020-02-21 LAB — ABO/RH: ABO/RH(D): O POS

## 2020-02-21 LAB — CBG MONITORING, ED: Glucose-Capillary: 127 mg/dL — ABNORMAL HIGH (ref 70–99)

## 2020-02-21 LAB — SARS CORONAVIRUS 2 BY RT PCR (HOSPITAL ORDER, PERFORMED IN ~~LOC~~ HOSPITAL LAB): SARS Coronavirus 2: NEGATIVE

## 2020-02-21 LAB — PREPARE RBC (CROSSMATCH)

## 2020-02-21 MED ORDER — SODIUM CHLORIDE 0.9 % IV SOLN
10.0000 mL/h | Freq: Once | INTRAVENOUS | Status: AC
  Administered 2020-02-21: 10 mL/h via INTRAVENOUS

## 2020-02-21 MED ORDER — MIDAZOLAM HCL 2 MG/2ML IJ SOLN
2.0000 mg | INTRAMUSCULAR | Status: DC | PRN
  Administered 2020-02-21 – 2020-02-22 (×4): 2 mg via INTRAVENOUS
  Filled 2020-02-21 (×4): qty 2

## 2020-02-21 MED ORDER — DOCUSATE SODIUM 50 MG/5ML PO LIQD
100.0000 mg | Freq: Two times a day (BID) | ORAL | Status: DC
  Administered 2020-02-22 (×2): 100 mg via ORAL
  Filled 2020-02-21 (×2): qty 10

## 2020-02-21 MED ORDER — SODIUM CHLORIDE 0.9 % IV BOLUS (SEPSIS)
1000.0000 mL | Freq: Once | INTRAVENOUS | Status: AC
  Administered 2020-02-21: 1000 mL via INTRAVENOUS

## 2020-02-21 MED ORDER — SODIUM CHLORIDE 0.9% IV SOLUTION: Freq: Once | INTRAVENOUS | Status: AC

## 2020-02-21 MED ORDER — ETOMIDATE 2 MG/ML IV SOLN
INTRAVENOUS | Status: AC | PRN
  Administered 2020-02-21: 20 mg via INTRAVENOUS

## 2020-02-21 MED ORDER — FENTANYL CITRATE (PF) 100 MCG/2ML IJ SOLN
50.0000 ug | INTRAMUSCULAR | Status: DC | PRN
  Administered 2020-02-22 (×2): 50 ug via INTRAVENOUS

## 2020-02-21 MED ORDER — NOREPINEPHRINE 4 MG/250ML-% IV SOLN
INTRAVENOUS | Status: AC
  Filled 2020-02-21: qty 250

## 2020-02-21 MED ORDER — EPINEPHRINE 0.1 MG/10ML (10 MCG/ML) SYRINGE FOR IV PUSH (FOR BLOOD PRESSURE SUPPORT)
PREFILLED_SYRINGE | INTRAVENOUS | Status: AC
  Administered 2020-02-21: 20 ug
  Filled 2020-02-21: qty 10

## 2020-02-21 MED ORDER — MIDAZOLAM HCL 2 MG/2ML IJ SOLN
2.0000 mg | INTRAMUSCULAR | Status: DC | PRN
  Administered 2020-02-21 – 2020-02-22 (×2): 2 mg via INTRAVENOUS
  Filled 2020-02-21 (×3): qty 2

## 2020-02-21 MED ORDER — STERILE WATER FOR INJECTION IV SOLN: INTRAVENOUS | Status: DC

## 2020-02-21 MED ORDER — FENTANYL CITRATE (PF) 100 MCG/2ML IJ SOLN: 50.0000 ug | INTRAMUSCULAR | Status: DC | PRN

## 2020-02-21 MED ORDER — SODIUM BICARBONATE 8.4 % IV SOLN
100.0000 meq | Freq: Once | INTRAVENOUS | Status: AC
  Administered 2020-02-21: 100 meq via INTRAVENOUS
  Filled 2020-02-21: qty 100
  Filled 2020-02-21: qty 50

## 2020-02-21 MED ORDER — SODIUM CHLORIDE 0.9 % IV SOLN
1000.0000 mL | INTRAVENOUS | Status: DC
  Administered 2020-02-21: 1000 mL via INTRAVENOUS

## 2020-02-21 MED ORDER — DOCUSATE SODIUM 100 MG PO CAPS: 100.0000 mg | ORAL_CAPSULE | Freq: Two times a day (BID) | ORAL | Status: DC | PRN

## 2020-02-21 MED ORDER — SODIUM CHLORIDE 0.9 % IV BOLUS
1000.0000 mL | Freq: Once | INTRAVENOUS | Status: AC
  Administered 2020-02-21: 1000 mL via INTRAVENOUS

## 2020-02-21 MED ORDER — NOREPINEPHRINE 4 MG/250ML-% IV SOLN
0.0000 ug/min | INTRAVENOUS | Status: DC
  Administered 2020-02-21: 10 ug/min via INTRAVENOUS
  Filled 2020-02-21: qty 250

## 2020-02-21 MED ORDER — FENTANYL 2500MCG IN NS 250ML (10MCG/ML) PREMIX INFUSION
25.0000 ug/h | INTRAVENOUS | Status: DC
  Administered 2020-02-21: 75 ug/h via INTRAVENOUS
  Filled 2020-02-21: qty 250

## 2020-02-21 MED ORDER — PANTOPRAZOLE SODIUM 40 MG IV SOLR
40.0000 mg | Freq: Every day | INTRAVENOUS | Status: DC
  Filled 2020-02-21: qty 40

## 2020-02-21 MED ORDER — POLYETHYLENE GLYCOL 3350 17 G PO PACK: 17.0000 g | PACK | Freq: Every day | ORAL | Status: DC | PRN

## 2020-02-21 MED ORDER — STERILE WATER FOR INJECTION IV SOLN
Freq: Once | INTRAVENOUS | Status: AC
  Filled 2020-02-21: qty 850

## 2020-02-21 MED ORDER — FENTANYL BOLUS VIA INFUSION
50.0000 ug | INTRAVENOUS | Status: DC | PRN
  Filled 2020-02-21: qty 50

## 2020-02-21 MED ORDER — NOREPINEPHRINE 16 MG/250ML-% IV SOLN
0.0000 ug/min | INTRAVENOUS | Status: DC
  Administered 2020-02-21: 2 ug/min via INTRAVENOUS
  Administered 2020-02-22: 20 ug/min via INTRAVENOUS
  Administered 2020-02-23: 22 ug/min via INTRAVENOUS
  Filled 2020-02-21 (×3): qty 250

## 2020-02-21 MED ORDER — IOHEXOL 300 MG/ML  SOLN
100.0000 mL | Freq: Once | INTRAMUSCULAR | Status: AC | PRN
  Administered 2020-02-21: 100 mL via INTRAVENOUS

## 2020-02-21 MED ORDER — POLYETHYLENE GLYCOL 3350 17 G PO PACK
17.0000 g | PACK | Freq: Every day | ORAL | Status: DC
  Administered 2020-02-22 – 2020-02-23 (×2): 17 g via ORAL
  Filled 2020-02-21 (×2): qty 1

## 2020-02-21 MED ORDER — SUCCINYLCHOLINE CHLORIDE 20 MG/ML IJ SOLN
INTRAMUSCULAR | Status: AC | PRN
  Administered 2020-02-21: 125 mg via INTRAVENOUS

## 2020-02-21 NOTE — ED Provider Notes (Signed)
[facility] EMERGENCY DEPARTMENT Provider Note   CSN: [id number] Arrival date & time: 02/08/2020  1350     History No chief complaint on file.   Eric Houston is a 64 y.o. male.  HPI Patient presents by EMS for evaluation of weakness, and collapse.  He was found lying the front yard when his son got home from work this afternoon.  Son last saw the patient yesterday, went to work at 5 AM this morning.  His son does not know of any recent issues, but was not with him today.  EMS initially reported that patient was here for evaluation of heat related illness.  I was called to the room because the patient had developed decreased responsiveness.  At that time he was grayish in color, struggling to breathe, and required airway management.  Level five caveat-altered mental status    Past Medical History:  Diagnosis Date  . Arthritis   . Asthmatic bronchitis   . COPD (chronic obstructive pulmonary disease) ([place])   . Diabetes mellitus type II   . Headache   . Hydrocephalus (HCC)    vp shunt  . Hypertension   . Shortness of breath dyspnea   . Sleep apnea     Patient Active Problem List   Diagnosis Date Noted  . Stevens-Johnson syndrome involving 30 to 39 percent of total body surface area ([place]) 08/12/2016  . Conjunctivitis of both eyes 08/09/2016  . Rash 08/09/2016  . Sore throat 08/09/2016  . Viral illness 08/09/2016  . Dehydration 08/09/2016  . Thrombocytopenia ([place]) 08/09/2016  . Bilateral lower extremity edema 08/09/2016  . COPD (chronic obstructive pulmonary disease) ([place])   . Severe obesity (BMI >= 40) ([place]) 11/06/2015  . Type 2 diabetes mellitus with hyperglycemia, without long-term current use of insulin ([place])   . Dyspnea 09/24/2015  . Asthma 09/24/2015  . Well controlled diabetes mellitus ([place])   . Essential hypertension   . Diabetes mellitus, type 2 ([place])   . Asthmatic bronchitis   . Sleep apnea   . Hydrocephalus ([place])   . PLANTAR FASCIITIS,  LEFT 11/28/2008  . HEEL PAIN, LEFT 11/28/2008  . PES PLANUS 11/28/2008  . SHOULDER PAIN, LEFT 10/03/2008  . KNEE PAIN, BILATERAL 10/03/2008    Past Surgical History:  Procedure Laterality Date  . neck fusion    . TONSILLECTOMY    . VENTRICULOPERITONEAL SHUNT     x 2       Family History  Problem Relation Age of Onset  . Emphysema Father        smoked  . Diabetes Father   . Heart disease Father   . Heart attack Father   . Cancer Mother        pancreatic  . Diabetes Mother   . Heart disease Mother        Born with hole in the heart  . Stroke Mother   . Diabetes Brother     Social History   Tobacco Use  . Smoking status: Former Smoker    Packs/day: 2.00    Years: 2.00    Pack years: 4.00    Types: Cigarettes    Quit date: 11/14/1984    Years since quitting: 35.2  . Smokeless tobacco: Never Used  Substance Use Topics  . Alcohol use: Yes    Alcohol/week: 4.0 standard drinks    Types: 4 Shots of liquor per week    Comment: mixed drinks on the weekend  . Drug use: No  Home Medications Prior to Admission medications   Medication Sig Start Date End Date Taking? Authorizing Provider  losartan (COZAAR) 50 MG tablet Take 50 mg by mouth daily. 07/25/16  Yes [provider]  albuterol (VENTOLIN HFA) 108 (90 BASE) MCG/ACT inhaler Inhale 2 puffs into the lungs every 4 (four) hours as needed.     [provider]  atorvastatin (LIPITOR) 20 MG tablet Take 1 tablet (20 mg total) by mouth daily. 07/10/16 01/26/20  End, Harrell Gave, MD  budesonide-formoterol (SYMBICORT) 160-4.5 MCG/ACT inhaler Inhale 2 puffs into the lungs 2 (two) times daily. Patient not taking: Reported on 09/25/2017 09/26/15   Barton Dubois, MD  [facility] SR) 100 MG 12 hr tablet Take 100 mg by mouth daily. 07/29/17   [provider]  cyclobenzaprine (FLEXERIL) 10 MG tablet Take 10 mg by mouth 3 (three) times daily as needed for muscle spasms. 12/28/19   [provider]  guaiFENesin-dextromethorphan (ROBITUSSIN DM) 100-10 MG/5ML syrup Take 5 mLs by mouth every 4 (four) hours as needed for cough. 09/26/15   Barton Dubois, MD  insulin aspart (NOVOLOG) 100 UNIT/ML injection Inject 0-5 Units into the skin at bedtime. Patient not taking: Reported on 09/25/2017 08/12/16   Geradine Girt, DO  insulin aspart (NOVOLOG) 100 UNIT/ML injection Inject 0-9 Units into the skin 3 (three) times daily with meals. Patient not taking: Reported on 09/25/2017 08/12/16   Geradine Girt, DO  losartan-hydrochlorothiazide (HYZAAR) 100-25 MG tablet Take 1 tablet by mouth daily. 02/04/20   [provider]  metFORMIN (GLUCOPHAGE) 500 MG tablet Take 500 mg by mouth daily after breakfast. 08/28/16   [provider]  oxyCODONE-acetaminophen (PERCOCET) 10-325 MG tablet Take 10-325 tablets by mouth every 8 (eight) hours as needed for pain. 06/30/16   [provider]  potassium chloride SA (KLOR-CON) 20 MEQ tablet Take 20 mEq by mouth daily. 11/27/19   [provider]  sertraline (ZOLOFT) 100 MG tablet Take 100 mg by mouth daily. 07/29/17   [provider]    Allergies    Allopurinol and Sulfa antibiotics  Review of Systems   Review of Systems  Unable to perform ROS: Mental status change    Physical Exam Updated Vital Signs BP (!) 114/58 (BP Location: Right Arm)   Pulse 100   Temp (S) 98.8 F (37.1 C) (Rectal)   Resp (!) 26   Ht 5\' 6"  (1.676 m)   Wt 95.3 kg   SpO2 100%   BMI 33.89 kg/m   Physical Exam Vitals and nursing note reviewed.  Constitutional:      General: He is in acute distress.     Appearance: He is well-developed. He is ill-appearing and toxic-appearing.  HENT:     Head: Normocephalic and atraumatic.     Right Ear: External ear normal.     Left Ear: External ear normal.     Nose: No congestion.     Mouth/Throat:     Comments: Very dry oral mucous membranes Eyes:     Conjunctiva/sclera: Conjunctivae  normal.     Pupils: Pupils are equal, round, and reactive to light.  Neck:     Trachea: Phonation normal.  Cardiovascular:     Rate and Rhythm: Regular rhythm. Tachycardia present.     Heart sounds: Normal heart sounds.  Pulmonary:     Comments: Shallow breaths, increased respiratory rate.  No increased work of breathing. Abdominal:     General: There is distension.     Palpations: Abdomen  is soft. There is no mass.     Tenderness: There is no abdominal tenderness.     Hernia: No hernia is present.  Musculoskeletal:        General: Swelling present. No deformity.     Cervical back: Normal range of motion and neck supple.     Right lower leg: Edema present.     Left lower leg: Edema present.  Skin:    General: Skin is warm and dry.     Coloration: Skin is not jaundiced or pale.     Comments: Possible bruising right flank, unclear duration.  No associated tenderness.  Neurological:     Mental Status: He is alert.     Cranial Nerves: No cranial nerve deficit.     Motor: No abnormal muscle tone.  Psychiatric:     Comments: Obtunded, restless     ED Results / Procedures / Treatments   Labs (all labs ordered are listed, but only abnormal results are displayed) Labs Reviewed  CBC WITH DIFFERENTIAL/PLATELET - Abnormal; Notable for the following components:      Result Value   WBC 21.2 (*)    RBC 2.35 (*)    Hemoglobin 6.9 (*)    HCT 24.1 (*)    MCV 102.6 (*)    MCHC 28.6 (*)    RDW 17.5 (*)    All other components within normal limits  HEPATIC FUNCTION PANEL - Abnormal; Notable for the following components:   Total Protein 4.9 (*)    Albumin 1.9 (*)    AST 145 (*)    ALT 46 (*)    Alkaline Phosphatase 191 (*)    Bilirubin, Direct 0.4 (*)    All other components within normal limits  CBG MONITORING, ED - Abnormal; Notable for the following components:   Glucose-Capillary 127 (*)    All other components within normal limits  POCT I-STAT 7, (LYTES, BLD GAS, ICA,H+H) -  Abnormal; Notable for the following components:   pH, Arterial 6.866 (*)    pCO2 arterial 51.3 (*)    pO2, Arterial 547 (*)    Bicarbonate 9.3 (*)    TCO2 11 (*)    Acid-base deficit 22.0 (*)    Calcium, Ion 1.41 (*)    HCT 17.0 (*)    Hemoglobin 5.8 (*)    All other components within normal limits  SARS CORONAVIRUS 2 BY RT PCR (HOSPITAL ORDER, [facility])  URINALYSIS, ROUTINE W REFLEX MICROSCOPIC  BLOOD GAS, ARTERIAL  PREPARE RBC (CROSSMATCH)  TYPE AND SCREEN  ABO/RH    EKG None    Date: 02/09/2020  Rate: 126  Rhythm: sinus tachycardia  QRS Axis: normal  PR and QT Intervals: normal  ST/T Wave abnormalities: nonspecific T wave changes  PR and QRS Conduction Disutrbances:none     Radiology CT Head Wo Contrast  Result Date: 02/22/2020 CLINICAL DATA:  Recent fall with syncopal episode EXAM: CT HEAD WITHOUT CONTRAST CT CERVICAL SPINE WITHOUT CONTRAST TECHNIQUE: Multidetector CT imaging of the head and cervical spine was performed following the standard protocol without intravenous contrast. Multiplanar CT image reconstructions of the cervical spine were also generated. 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 Bilateral ventriculostomy catheters are seen. Atrophic changes are noted. Mild encephalomalacia is again noted in the right frontal region. Stable interhemispheric cyst is noted. No findings to suggest acute hemorrhage, acute infarction or space-occupying mass lesion are noted.  Vascular: No hyperdense vessel or unexpected calcification. Skull: Normal. Negative for fracture or focal lesion. Sinuses/Orbits: No acute finding. Other: None. CT CERVICAL SPINE FINDINGS Alignment: Within normal limits. Skull base and vertebrae: 7 cervical segments are well visualized. Changes consistent with prior fusion at C4-5 and C5-6 are seen with anterior fixation. Facet hypertrophic  changes are noted. No acute fracture is seen. Multilevel osteophytic changes are seen. The odontoid is within normal limits. No acute fracture or acute facet abnormality is noted. Soft tissues and spinal canal: Surrounding soft tissue structures show endotracheal tube and gastric catheter in place. No acute soft tissue abnormality is noted. Shunt catheters are noted in the posterior soft tissues of the neck. Upper chest: Visualized lung apices are within normal limits. 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 No acute intracranial abnormality is noted. CT of the cervical spine: Multilevel degenerative change and postoperative change without acute abnormality. Electronically Signed   By: Inez Catalina M.D.   On: 02/11/2020 16:58   CT Cervical Spine Wo Contrast  Result Date: 02/24/2020 CLINICAL DATA:  Recent fall with syncopal episode EXAM: CT HEAD WITHOUT CONTRAST CT CERVICAL SPINE WITHOUT CONTRAST TECHNIQUE: Multidetector CT imaging of the head and cervical spine was performed following the standard protocol without intravenous contrast. Multiplanar CT image reconstructions of the cervical spine were also generated. 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 Bilateral ventriculostomy catheters are seen. Atrophic changes are noted. Mild encephalomalacia is again noted in the right frontal region. Stable interhemispheric cyst is noted. No findings to suggest acute hemorrhage, acute infarction or space-occupying mass lesion are noted. Vascular: No hyperdense vessel or unexpected calcification. Skull: Normal. Negative for fracture or focal lesion. Sinuses/Orbits: No acute finding. Other: None. CT CERVICAL SPINE FINDINGS Alignment: Within normal limits. Skull base and vertebrae: 7 cervical segments are well visualized. Changes consistent with prior  fusion at C4-5 and C5-6 are seen with anterior fixation. Facet hypertrophic changes are noted. No acute fracture is seen. Multilevel osteophytic changes are seen. The odontoid is within normal limits. No acute fracture or acute facet abnormality is noted. Soft tissues and spinal canal: Surrounding soft tissue structures show endotracheal tube and gastric catheter in place. No acute soft tissue abnormality is noted. Shunt catheters are noted in the posterior soft tissues of the neck. Upper chest: Visualized lung apices are within normal limits. 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 No acute intracranial abnormality is noted. CT of the cervical spine: Multilevel degenerative change and postoperative change without acute abnormality. Electronically Signed   By: Inez Catalina M.D.   On: 02/16/2020 16:58   DG Chest Port 1 View  Result Date: 02/10/2020 CLINICAL DATA:  Post intubation EXAM: PORTABLE CHEST 1 VIEW COMPARISON:  09/25/2017 FINDINGS: Endotracheal tube tip partially obscured by overlying leads. The tip appears to be just above the carina. Esophageal tube tip below the diaphragm but incompletely visualized. Faintly visible shunt tubing over the right chest. No focal airspace disease or effusion. Stable cardiomediastinal silhouette with aortic atherosclerosis. No pneumothorax. IMPRESSION: 1. Endotracheal tube tip just above the carina. 2. Clear lung fields Electronically Signed   By: Donavan Foil M.D.   On: 02/25/2020 16:03    Procedures Procedure Name: Intubation Date/Time: 02/16/2020 4:56 PM Performed by: Daleen Bo, MD Pre-anesthesia Checklist: Patient identified, Patient being  monitored, Emergency Drugs available, Timeout performed and Suction available Oxygen Delivery Method: Non-rebreather mask Preoxygenation: Pre-oxygenation with 100% oxygen Induction Type: Rapid sequence Ventilation: Mask ventilation without  difficulty Laryngoscope Size: Glidescope and 4 Grade View: Grade II Tube size: 7.5 mm Number of attempts: 1 Placement Confirmation: ETT inserted through vocal cords under direct vision,  CO2 detector and Breath sounds checked- equal and bilateral Secured at: 23 cm Dental Injury: Teeth and Oropharynx as per pre-operative assessment     .Critical Care Performed by: Daleen Bo, MD Authorized by: Daleen Bo, MD   Critical care provider statement:    Critical care time (minutes):  75   Critical care start time:  02/20/2020 2:20 PM   Critical care time was exclusive of:  Separately billable procedures and treating other patients   Critical care was necessary to treat or prevent imminent or life-threatening deterioration of the following conditions:  Trauma   Critical care was time spent personally by me on the following activities:  Blood draw for specimens, development of treatment plan with patient or surrogate, discussions with consultants, evaluation of patient's response to treatment, examination of patient, obtaining history from patient or surrogate, ordering and performing treatments and interventions, ordering and review of laboratory studies, pulse oximetry, re-evaluation of patient's condition, review of old charts and ordering and review of radiographic studies   (including critical care time)  Medications Ordered in ED Medications  etomidate (AMIDATE) injection (20 mg Intravenous Given 02/26/2020 1526)  succinylcholine (ANECTINE) injection (125 mg Intravenous Given 02/20/2020 1526)  fentaNYL 2570mcg in NS 217mL (68mcg/ml) infusion-PREMIX (has no administration in time range)  fentaNYL (SUBLIMAZE) bolus via infusion 50 mcg (has no administration in time range)  midazolam (VERSED) injection 2 mg (has no administration in time range)  midazolam (VERSED) injection 2 mg (has no administration in time range)  norepinephrine (LEVOPHED) 4mg  in 257mL premix infusion (0 mcg/min  Intravenous Hold 02/22/2020 1651)  sodium chloride 0.9 % bolus 1,000 mL (0 mLs Intravenous Stopped 02/09/2020 1648)    Followed by  sodium chloride 0.9 % bolus 1,000 mL (0 mLs Intravenous Stopped 02/25/2020 1648)    Followed by  0.9 %  sodium chloride infusion (0 mLs Intravenous Stopped 02/01/2020 1649)  sodium bicarbonate 150 mEq in sterile water 1,000 mL infusion (has no administration in time range)  EPINEPHrine 0.1 MG/10ML injection (2 mcg  Given 02/04/2020 1700)  sodium chloride 0.9 % bolus 1,000 mL (0 mLs Intravenous Stopped 02/19/2020 1647)  0.9 %  sodium chloride infusion (10 mL/hr Intravenous New Bag/Given 01/29/2020 1650)  iohexol (OMNIPAQUE) 300 MG/ML solution 100 mL (100 mLs Intravenous Contrast Given 02/07/2020 1644)    ED Course  I have reviewed the triage vital signs and the nursing notes.  Pertinent labs & imaging results that were available during my care of the patient were reviewed by me and considered in my medical decision making (see chart for details).  Clinical Course as of Feb 20 1709  Wed Feb 21, 2020  1551 Urgent intubation for respiratory collapse.  Following intubation, skin color improved, more pink.  Patient blood pressure low, treated with epinephrine, with improvement, actually decreased then improved again.   [EW]  [id number] Hemoglobin returned 6.9, low, trauma scan ordered due to visible right flank contusion.   [EW]  [id number] Blood pressure stable for transfer to CT imaging.   [EW]  1700 Case discussed with trauma surgeon, Dr. Bobbye Morton.  At this point preliminary review of CT scan by me appears to be nontraumatic, with  diffuse tumors, and ascites, without clear evidence for trauma attic injury.  We will recontact Dr. Bobbye Morton if there appears to be a traumatic injury.   [EW]    Clinical Course User Index [EW] Daleen Bo, MD   MDM Rules/Calculators/A&P                       Patient Vitals for the past 24 hrs:  BP Temp Temp src Pulse Resp SpO2 Height Weight  02/15/2020 1708 (!)  114/58 -- -- 100 (!) 26 100 % -- --  01/30/2020 1700 123/69 -- -- (!) 101 -- 100 % -- --  02/17/2020 1615 -- -- -- (!) 101 -- -- -- --  02/09/2020 1600 (!) 144/80 -- -- -- 18 -- -- --  02/25/2020 1545 125/75 -- -- (!) 28 18 (!) 58 % -- --  02/24/2020 1540 (!) 80/50 -- -- (!) 109 18 (!) 84 % -- --  02/06/2020 1535 108/67 -- -- (!) 27 (!) 22 (!) 74 % -- --  02/11/2020 1530 108/67 -- -- (!) 109 18 97 % -- --  02/22/2020 1525 (!) 71/45 -- -- 66 (!) 21 94 % -- --  02/09/2020 1515 (!) 164/138 -- -- (!) 52 17 (!) 87 % -- --  01/31/2020 1445 -- -- -- 88 -- -- -- --  01/28/2020 1433 -- -- -- -- -- -- 5\' 6"  (1.676 m) 95.3 kg  02/19/2020 1430 -- -- -- 74 -- -- -- --  02/11/2020 1409 (!) 130/106 98.8 F (37.1 C) Rectal (!) 120 (!) 30 100 % -- --     Medical Decision Making:  This patient is presenting for evaluation of fall with suspected heatstroke, which does require a range of treatment options, and is a complaint that involves a high risk of morbidity and mortality. The differential diagnoses include heatstroke, traumatic injury, acute infection, metabolic instability. I decided to review old records, and in summary patient with reported alcoholism and debilitated state, not recently seen by any physicians.  I obtained additional historical information from his son, at the bedside.  Clinical Laboratory Tests Ordered, included CBC, Metabolic panel, Urinalysis and VBG, hepatic function panel. Review indicates anemia, hemoglobin 6.8, white count elevated, transaminitis. Radiologic Tests Ordered, included portable chest x-ray, CT chest abdomen pelvis ordered for possible traumatic injury.  I independently Visualized: Radiologic images, which show intubation with ET tube, near carina, abdominal CT with large bowel tumor with mets to liver, and ascites without hematoma  Cardiac Monitor Tracing which shows sinus tachycardia   I discussed the radiologic results with Dr. Carmine Savoy, radiologist.  Patient has right mainstem  bronchus intubation, plan to pull to back 1 cm.  CT abdomen pelvis consistent with bowel tumor, metastatic to liver.  No evidence for intra-abdominal hematoma.   Critical Interventions-clinical evaluation, laboratory testing, emergent intubation for respiratory failure, blood transfusion, observation reassessment  After These Interventions, the Patient was reevaluated and was found improved from respiratory status, and hypotension.  Patient was notably discovered anemia and likely source being intra-abdominal cancer, suspected to be bowel.  Significant acidosis is likely from cancer.  He will require admission for stabilization, and likely conversion to palliative care.  CRITICAL CARE-yes Performed by: Daleen Bo  Nursing Notes Reviewed/ Care Coordinated Applicable Imaging Reviewed Interpretation of Laboratory Data incorporated into ED treatment  Plan-disposition by Dr. Alvino Chapel following consultation with intensivist, oncology, and admitting service.    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)  Acidosis    Rx / DC Orders ED Discharge Orders    None       Daleen Bo, MD 02/01/2020 1713

## 2020-02-21 NOTE — ED Notes (Addendum)
Son came back to desk saying that his dad wanted to sit up,  On arrival pt was semi responsive, not answering questions, pt pale, pupils dilated, dr Eulis Foster notified.

## 2020-02-21 NOTE — Progress Notes (Signed)
ICU RT aware of pt transferred to ICU.  Report given.  No apparent complications noted while transporting pt on vent to ICU.

## 2020-02-21 NOTE — Procedures (Signed)
Intubation Procedure Note Eric Houston [id number] 05/25/1956  Procedure: Intubation Indications: Respiratory insufficiency  Procedure Details Consent: Unable to obtain consent because of emergent medical necessity. Time Out: Verified patient identification, verified procedure, site/side was marked, verified correct patient position, special equipment/implants available, medications/allergies/relevent history reviewed, required imaging and test results available.  Performed  Maximum sterile technique was used including antiseptics, cap, gloves, gown, hand hygiene, mask and sheet.  MAC and 4    Evaluation Hemodynamic Status: BP stable throughout; O2 sats: stable throughout Patient's Current Condition: stable Complications: No apparent complications Patient did tolerate procedure well. Chest X-ray ordered to verify placement.  CXR: tube position acceptable.  Patient intubated by ED MD without complications.  Bilateral breath sounds auscultated.  Positive color change noted.  Chest xray obtained.     Judith Part 02/08/2020

## 2020-02-21 NOTE — ED Notes (Signed)
EPi 1mcg/ml given

## 2020-02-21 NOTE — H&P (Signed)
NAME:  Eric Houston, MRN:  [id number], DOB:  28-Feb-1956, LOS: 0 ADMISSION DATE:  02/09/2020, CONSULTATION DATE:  02/08/2020 REFERRING MD:  Dr. Alvino Chapel, CHIEF COMPLAINT:  Respiratory arrest/ shock  Brief History   25 yoM who was found laying in yard, reportedly for over 4 hours after complaints of dizziness/ weakness who acutely decompensated in ER with altered mental status and respiratory arrest, now intubated with shock and profound acidosis.  CT of abd/ pelvis concerning for diffuse metastatic process, origin unclear.    History of present illness   HPI obtained from medical chart review as patient is unresponsive, intubated, and mechanical ventilation.  Family not able to be found or reached by phone at this time.   64 year old male with prior history of COPD, DMT2, HA, hydrocephalus with prior VP shunt, HTN, OSA, arthritis, and former smoker presenting to ER by EMS after patient was found lying in the yard by his son.  Patient reported he got dizzy and fell and couldn't get up and had been laying there for around 4 hours.  He was initially able to answer questions,  alert, slightly tachycardic otherwise hemodynamically stable on arrival to ER.  Shortly after, staff were called into room where he acutely decompensated, was unresponsive and hypoxic requiring emergent intubation.  Afterwards he became acutely hypotensive requiring vasopressor support.  Workup noted for WBC 21.2, Hgb 6.9,  Alk phos 191, albumin 1.9, AST 145, ALT 46, SARS2 negative, foley placed but no urine output thus far, BMET pending.  2 units of PRBC ordered along with bicarb gtt.  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  He has remained unresponsive, not requiring sedation.  PCCM called for admit.   Of note, patient had  recent ER evaluation on 01/26/2020 for generalized weakness and reported syncopal episodes/ falls.  Workup was unremarkable except for a Hgb of 9 and discharged home with patient to follow up outpatient.    Past Medical History  COPD, DMT2, HA, hydrocephalus with prior VP shunt, HTN, OSA, arthritis, former smoker   Significant Hospital Events   5/26 Admit  Consults:   Procedures:  5/26 ETT >> 5/26 Foley >>  Significant Diagnostic Tests:  5/26 CT chest/ a/p >> 1. Large mesenteric mass within the left mid abdomen, compatible with malignancy of uncertain etiology. 2. Innumerable hepatic metastases. 3. Mesenteric nodularity consistent with mesenteric implants. 4. Large volume ascites.  No evidence of intra-abdominal hemorrhage. 5. Right mainstem intubation. Recommend retracting endotracheal tube 3 cm. Otherwise no acute intrathoracic process.  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 No acute intracranial abnormality is noted.  CT of the cervical spine: Multilevel degenerative change and postoperative change without acute abnormality  Micro Data:  5/26 SARS2 >> neg 5/26  BCx2 >> 5/26 UC >>  Antimicrobials:  5/26 zosyn >> 5/26 vancomycin  >>  Interim history/subjective:  On Levophed 20 mcg/min via peripheral IV   Objective   Blood pressure (!) 98/54, pulse 99, temperature (S) 98.8 F (37.1 C), temperature source Rectal, resp. rate (!) 26, height _0  (1.676 m), weight 95.3 kg, SpO2 100 %.    Vent Mode: PRVC FiO2 (%):  [40 %-100 %] 40 % Set Rate:  [18 bmp-26 bmp] 26 bmp Vt Set:  [510  mL] 510 mL PEEP:  [5 cmH20-8 cmH20] 5 cmH20 Plateau Pressure:  [19 cmH20] 19 cmH20   Intake/Output Summary (Last 24 hours) at 02/20/2020 1750 Last data filed at 02/05/2020 1649 Gross per 24 hour  Intake 4000 ml  Output --  Net 4000 ml   Filed Weights   02/01/2020 1433  Weight: 95.3 kg   Examination: General:  Critically  ill, elderly appearing male unresponsive on MV HEENT: MM pale, pupils 4/reactive, ETT/ OGT Neuro: unresponsive to noxious stimuli  CV: rr, no murmur PULM:  MV supported breaths, CTA  GI: large, distended, hypoBS, foley- no urine output Extremities: cool/ mottled/dry, 3+ LE edema  Skin: skin tear to right ankle, excoriation to left groin/ panus, hands and feet very dirty   Resolved Hospital Problem list    Assessment & Plan:   Acute hypoxic respiratory failure - unclear etiology.  Had sudden respiratory arrest while in ED but had no symptoms concerning for PE.  CT chest was negative (standard CT, not CTA). - Continue full vent support. - No weaning unless mental status improves. - Will get LE duplex for completeness to r/o DVT / possible PE given findings on CT abd / pelv of probable metastatic disease. - Follow CXR.  Profound metabolic + respiratory acidosis - unclear etiology. - Agree with bicarb drip for now. - F/u on repeat gas. - Follow BMP.  Shock - unclear etiology thus far. - Continue levophed as needed for goal MAP > 65. - Defer CVL placement for now until after PRBC transfusion and bicarb then reassess pressor requirements. - Empiric broad spectrum abx for now and follow cultures.  Anemia - no obvious bleeding. Currently receiving 1u PRBC. - Follow H/H and maintain Hgb > 7.  R/o rhabdo - unknown downtime before being found by son. - Assess CK.  Concern for underlying malignancy with extensive hepatic mets. - Needs family discussions (I attempted to call phone #'s in chart twice but no answer on home or mobile phone and # listed for brother is incorrect). - Will ask social work to assist with locating family contact info.  Best practice:  Diet: NPO Pain/Anxiety/Delirium protocol (if indicated): prn fentanyl/ versed  VAP protocol (if indicated): yes DVT prophylaxis: SCDs only  GI prophylaxis: PPI Glucose control: SSI if glucose > 180 Mobility: BR Code  Status: Full  Family Communication: multiple attempts made without sucess.  See above Disposition: ICU  Labs   CBC: Recent Labs  Lab 01/28/2020 1535 01/29/2020 1657  WBC 21.2*  --   NEUTROABS PENDING  --   HGB 6.9* 5.8*  HCT 24.1* 17.0*  MCV 102.6*  --   PLT 286  --     Basic Metabolic Panel: Recent Labs  Lab 02/10/2020 1657  NA 140  K 3.5   GFR: CrCl cannot be calculated (Patient's most recent lab result is older than the maximum 21 days allowed.). Recent Labs  Lab 02/12/2020 1535  WBC 21.2*    Liver Function Tests: Recent Labs  Lab 02/02/2020 1535  AST 145*  ALT 46*  ALKPHOS 191*  BILITOT 0.9  PROT 4.9*  ALBUMIN 1.9*   No results for input(s): LIPASE, AMYLASE in the last 168 hours. No results for input(s): AMMONIA in the last 168 hours.  ABG    Component Value Date/Time   PHART 6.866 (LL) 02/04/2020 1657   PCO2ART 51.3 (H) 02/13/2020 1657   PO2ART 547 (H) 02/09/2020 1657   HCO3 9.3 (L) 02/10/2020 1657   TCO2 11 (L)  02/07/2020 1657   ACIDBASEDEF 22.0 (H) 02/26/2020 1657   O2SAT 100.0 02/08/2020 1657     Coagulation Profile: No results for input(s): INR, PROTIME in the last 168 hours.  Cardiac Enzymes: No results for input(s): CKTOTAL, CKMB, CKMBINDEX, TROPONINI in the last 168 hours.  HbA1C: Hgb A1c MFr Bld  Date/Time Value Ref Range Status  08/09/2016 05:02 PM 5.7 (H) 4.8 - 5.6 % Final    Comment:    (NOTE)         Pre-diabetes: 5.7 - 6.4         Diabetes: >6.4         Glycemic control for adults with diabetes: <7.0   12/05/2009 10:50 PM 5.7 4.6 - 6.1 % Final    Comment:    See lab report for associated comment(s)    CBG: Recent Labs  Lab 02/20/2020 1514  GLUCAP 127*    Review of Systems:   Unable   Past Medical History  He,  has a past medical history of Arthritis, Asthmatic bronchitis, COPD (chronic obstructive pulmonary disease) ([place]), Diabetes mellitus type II, Headache, Hydrocephalus ([place]), Hypertension, Shortness of breath  dyspnea, and Sleep apnea.   Surgical History    Past Surgical History:  Procedure Laterality Date  . neck fusion    . TONSILLECTOMY    . VENTRICULOPERITONEAL SHUNT     x 2     Social History  Unable to verify   Family History   His family history includes Cancer in his mother; Diabetes in his brother, father, and mother; Emphysema in his father; Heart attack in his father; Heart disease in his father and mother; Stroke in his mother.   Allergies Allergies  Allergen Reactions  . 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 Sulfa Antibiotics     rash     Home Medications  Prior to Admission medications   Medication Sig Start Date End Date Taking? Authorizing Provider  losartan (COZAAR) 50 MG tablet Take 50 mg by mouth daily. 07/25/16  Yes [provider]  albuterol (VENTOLIN HFA) 108 (90 BASE) MCG/ACT inhaler Inhale 2 puffs into the lungs every 4 (four) hours as needed.     [provider]  atorvastatin (LIPITOR) 20 MG tablet Take 1 tablet (20 mg total) by mouth daily. 07/10/16 01/26/20  End, Harrell Gave, MD  budesonide-formoterol (SYMBICORT) 160-4.5 MCG/ACT inhaler Inhale 2 puffs into the lungs 2 (two) times daily. Patient not taking: Reported on 09/25/2017 09/26/15   Barton Dubois, MD  buPROPion [facility] SR) 100 MG 12 hr tablet Take 100 mg by mouth daily. 07/29/17   [provider]  cyclobenzaprine (FLEXERIL) 10 MG tablet Take 10 mg by mouth 3 (three) times daily as needed for muscle spasms. 12/28/19   [provider]  guaiFENesin-dextromethorphan (ROBITUSSIN DM) 100-10 MG/5ML syrup Take 5 mLs by mouth every 4 (four) hours as needed for cough. 09/26/15   Barton Dubois, MD  insulin aspart (NOVOLOG) 100 UNIT/ML injection Inject 0-5 Units into the skin at bedtime. Patient not taking: Reported on 09/25/2017 08/12/16   Geradine Girt, DO  insulin aspart (NOVOLOG) 100 UNIT/ML injection Inject 0-9 Units  into the skin 3 (three) times daily with meals. Patient not taking: Reported on 09/25/2017 08/12/16   Geradine Girt, DO  losartan-hydrochlorothiazide (HYZAAR) 100-25 MG tablet Take 1 tablet by mouth daily. 02/04/20   [provider]  metFORMIN (GLUCOPHAGE) 500 MG tablet Take 500 mg by mouth daily  after breakfast. 08/28/16   [provider]  oxyCODONE-acetaminophen (PERCOCET) 10-325 MG tablet Take 10-325 tablets by mouth every 8 (eight) hours as needed for pain. 06/30/16   [provider]  potassium chloride SA (KLOR-CON) 20 MEQ tablet Take 20 mEq by mouth daily. 11/27/19   [provider]  sertraline (ZOLOFT) 100 MG tablet Take 100 mg by mouth daily. 07/29/17   [provider]      CCT: 31 mins  Kennieth Rad, MSN, AGACNP-BC [facility] 02/24/2020, 6:32 PM  See Amion for personal pager PCCM on call pager [phone]

## 2020-02-21 NOTE — ED Notes (Signed)
Son came to desk to say that his dad was thirsty, this nurse told him not to give him any water.

## 2020-02-21 NOTE — ED Triage Notes (Signed)
Pt to ED via [facility]- was found outside - states went outside and became dizzy and collapsed. Was outside for approx 3-4 hours per pt. - on EMS arrival, pt was warm, pale. On arrival to ED pt is alert/oriented x 4, has dirt and leaves over body, dirt in fold of panus, skin breakdown noted under panus, edema to bilateral lower legs-  C/o back and abd pain, states is chronic pain.  18G IV in left AC - received 500cc NS per EMS

## 2020-02-21 NOTE — Progress Notes (Signed)
RT NOTE: RT transported patient on ventilator from room ED38 to CT and back to room [id number] with no complications. Vitals are stable. RT will continue to monitor.

## 2020-02-21 NOTE — ED Notes (Signed)
Admitting paged to RN per her request 

## 2020-02-21 NOTE — Procedures (Signed)
Central Venous Catheter Insertion Procedure Note Eric Houston [id number] 11/03/1955  Procedure: Insertion of Central Venous Catheter Indications: Assessment of intravascular volume, Drug and/or fluid administration and Frequent blood sampling  Procedure Details Consent: Unable to obtain consent because of emergent medical necessity. Time Out: Verified patient identification, verified procedure, site/side was marked, verified correct patient position, special equipment/implants available, medications/allergies/relevent history reviewed, required imaging and test results available.  Performed  Maximum sterile technique was used including antiseptics, cap, gloves, gown, hand hygiene, mask and sheet. Skin prep: Chlorhexidine; local anesthetic administered A antimicrobial bonded/coated triple lumen catheter was placed in the left internal jugular vein using the Seldinger technique.  Evaluation Blood flow good Complications: No apparent complications Patient did tolerate procedure well. Chest X-ray ordered to verify placement.  CXR: pending.  [name], AGACNP-BC [facility]  PCCM Pgr: [phone]

## 2020-02-21 NOTE — ED Notes (Signed)
Pt starting to wake up. Pt biting at ET tube, moving arms and shaking head no. Fentanyl gtt increased to 169mcg. Critical care paged. Per critical care give 172mcg bolus of fentanyl and then 2mg  of versed every hr prn as needed. Will continue to monitor.

## 2020-02-21 NOTE — Progress Notes (Signed)
Post intubation ABG results obtained.  Increased RR from 18 to 26, decreased FIO2 from 100% to 40%, and decreased PEEP from 8 to 5.  Will continue to monitor.    Ref. Range 02/20/2020 16:57  Sample type Unknown ARTERIAL  pH, Arterial Latest Ref Range: 7.350 - 7.450  6.866 (LL)  pCO2 arterial Latest Ref Range: 32.0 - 48.0 mmHg 51.3 (H)  pO2, Arterial Latest Ref Range: 83.0 - 108.0 mmHg 547 (H)  TCO2 Latest Ref Range: 22 - 32 mmol/L 11 (L)  Acid-base deficit Latest Ref Range: 0.0 - 2.0 mmol/L 22.0 (H)  Bicarbonate Latest Ref Range: 20.0 - 28.0 mmol/L 9.3 (L)  O2 Saturation Latest Units: % 100.0  Collection site Unknown Radial

## 2020-02-22 ENCOUNTER — Inpatient Hospital Stay (HOSPITAL_COMMUNITY): Payer: Medicare HMO

## 2020-02-22 DIAGNOSIS — R579 Shock, unspecified: Secondary | ICD-10-CM

## 2020-02-22 DIAGNOSIS — E883 Tumor lysis syndrome: Principal | ICD-10-CM

## 2020-02-22 DIAGNOSIS — J9601 Acute respiratory failure with hypoxia: Secondary | ICD-10-CM

## 2020-02-22 DIAGNOSIS — Z9911 Dependence on respirator [ventilator] status: Secondary | ICD-10-CM

## 2020-02-22 DIAGNOSIS — J9600 Acute respiratory failure, unspecified whether with hypoxia or hypercapnia: Secondary | ICD-10-CM

## 2020-02-22 DIAGNOSIS — R609 Edema, unspecified: Secondary | ICD-10-CM

## 2020-02-22 DIAGNOSIS — L8990 Pressure ulcer of unspecified site, unspecified stage: Secondary | ICD-10-CM | POA: Insufficient documentation

## 2020-02-22 DIAGNOSIS — G9340 Encephalopathy, unspecified: Secondary | ICD-10-CM

## 2020-02-22 LAB — COMPREHENSIVE METABOLIC PANEL
ALT: 444 U/L — ABNORMAL HIGH (ref 0–44)
ALT: 693 U/L — ABNORMAL HIGH (ref 0–44)
ALT: 770 U/L — ABNORMAL HIGH (ref 0–44)
AST: 1695 U/L — ABNORMAL HIGH (ref 15–41)
AST: 2663 U/L — ABNORMAL HIGH (ref 15–41)
AST: 3737 U/L — ABNORMAL HIGH (ref 15–41)
Albumin: 1.8 g/dL — ABNORMAL LOW (ref 3.5–5.0)
Albumin: 1.9 g/dL — ABNORMAL LOW (ref 3.5–5.0)
Albumin: 2 g/dL — ABNORMAL LOW (ref 3.5–5.0)
Alkaline Phosphatase: 182 U/L — ABNORMAL HIGH (ref 38–126)
Alkaline Phosphatase: 195 U/L — ABNORMAL HIGH (ref 38–126)
Alkaline Phosphatase: 209 U/L — ABNORMAL HIGH (ref 38–126)
Anion gap: 16 — ABNORMAL HIGH (ref 5–15)
Anion gap: 15 (ref 5–15)
Anion gap: 22 — ABNORMAL HIGH (ref 5–15)
BUN: 47 mg/dL — ABNORMAL HIGH (ref 8–23)
BUN: 50 mg/dL — ABNORMAL HIGH (ref 8–23)
BUN: 52 mg/dL — ABNORMAL HIGH (ref 8–23)
CO2: 16 mmol/L — ABNORMAL LOW (ref 22–32)
CO2: 16 mmol/L — ABNORMAL LOW (ref 22–32)
CO2: 12 mmol/L — ABNORMAL LOW (ref 22–32)
Calcium: 9.1 mg/dL (ref 8.9–10.3)
Calcium: 8.5 mg/dL — ABNORMAL LOW (ref 8.9–10.3)
Calcium: 8 mg/dL — ABNORMAL LOW (ref 8.9–10.3)
Chloride: 107 mmol/L (ref 98–111)
Chloride: 107 mmol/L (ref 98–111)
Chloride: 108 mmol/L (ref 98–111)
Creatinine, Ser: 1.94 mg/dL — ABNORMAL HIGH (ref 0.61–1.24)
Creatinine, Ser: 2.19 mg/dL — ABNORMAL HIGH (ref 0.61–1.24)
Creatinine, Ser: 2.35 mg/dL — ABNORMAL HIGH (ref 0.61–1.24)
GFR calc Af Amer: 41 mL/min — ABNORMAL LOW (ref >60)
GFR calc Af Amer: 36 mL/min — ABNORMAL LOW (ref >60)
GFR calc Af Amer: 33 mL/min — ABNORMAL LOW (ref >60)
GFR calc non Af Amer: 36 mL/min — ABNORMAL LOW (ref >60)
GFR calc non Af Amer: 31 mL/min — ABNORMAL LOW (ref >60)
GFR calc non Af Amer: 28 mL/min — ABNORMAL LOW (ref >60)
Glucose, Bld: 86 mg/dL (ref 70–99)
Glucose, Bld: 113 mg/dL — ABNORMAL HIGH (ref 70–99)
Glucose, Bld: 118 mg/dL — ABNORMAL HIGH (ref 70–99)
Potassium: 5.2 mmol/L — ABNORMAL HIGH (ref 3.5–5.1)
Potassium: 5.3 mmol/L — ABNORMAL HIGH (ref 3.5–5.1)
Potassium: 5.9 mmol/L — ABNORMAL HIGH (ref 3.5–5.1)
Sodium: 139 mmol/L (ref 135–145)
Sodium: 138 mmol/L (ref 135–145)
Sodium: 142 mmol/L (ref 135–145)
Total Bilirubin: 1.6 mg/dL — ABNORMAL HIGH (ref 0.3–1.2)
Total Bilirubin: 1.7 mg/dL — ABNORMAL HIGH (ref 0.3–1.2)
Total Bilirubin: 1.4 mg/dL — ABNORMAL HIGH (ref 0.3–1.2)
Total Protein: 4.5 g/dL — ABNORMAL LOW (ref 6.5–8.1)
Total Protein: 4.6 g/dL — ABNORMAL LOW (ref 6.5–8.1)
Total Protein: 4.6 g/dL — ABNORMAL LOW (ref 6.5–8.1)

## 2020-02-22 LAB — TYPE AND SCREEN
ABO/RH(D): O POS
Antibody Screen: NEGATIVE
Unit division: 0
Unit division: 0

## 2020-02-22 LAB — POCT I-STAT 7, (LYTES, BLD GAS, ICA,H+H)
Acid-base deficit: 11 mmol/L — ABNORMAL HIGH (ref 0.0–2.0)
Bicarbonate: 14.9 mmol/L — ABNORMAL LOW (ref 20.0–28.0)
Calcium, Ion: 1.32 mmol/L (ref 1.15–1.40)
HCT: 26 % — ABNORMAL LOW (ref 39.0–52.0)
Hemoglobin: 8.8 g/dL — ABNORMAL LOW (ref 13.0–17.0)
O2 Saturation: 99 %
Potassium: 5.1 mmol/L (ref 3.5–5.1)
Sodium: 138 mmol/L (ref 135–145)
TCO2: 16 mmol/L — ABNORMAL LOW (ref 22–32)
pCO2 arterial: 31.3 mmHg — ABNORMAL LOW (ref 32.0–48.0)
pH, Arterial: 7.287 — ABNORMAL LOW (ref 7.350–7.450)
pO2, Arterial: 150 mmHg — ABNORMAL HIGH (ref 83.0–108.0)

## 2020-02-22 LAB — CBC
HCT: 26.6 % — ABNORMAL LOW (ref 39.0–52.0)
Hemoglobin: 8.8 g/dL — ABNORMAL LOW (ref 13.0–17.0)
MCH: 30 pg (ref 26.0–34.0)
MCHC: 33.1 g/dL (ref 30.0–36.0)
MCV: 90.8 fL (ref 80.0–100.0)
Platelets: 244 10*3/uL (ref 150–400)
RBC: 2.93 MIL/uL — ABNORMAL LOW (ref 4.22–5.81)
RDW: 17.2 % — ABNORMAL HIGH (ref 11.5–15.5)
WBC: 36.3 10*3/uL — ABNORMAL HIGH (ref 4.0–10.5)
nRBC: 0 % (ref 0.0–0.2)

## 2020-02-22 LAB — BASIC METABOLIC PANEL
Anion gap: 21 — ABNORMAL HIGH (ref 5–15)
BUN: 54 mg/dL — ABNORMAL HIGH (ref 8–23)
CO2: 14 mmol/L — ABNORMAL LOW (ref 22–32)
Calcium: 9.6 mg/dL (ref 8.9–10.3)
Chloride: 107 mmol/L (ref 98–111)
Creatinine, Ser: 1.85 mg/dL — ABNORMAL HIGH (ref 0.61–1.24)
GFR calc Af Amer: 44 mL/min — ABNORMAL LOW (ref >60)
GFR calc non Af Amer: 38 mL/min — ABNORMAL LOW (ref >60)
Glucose, Bld: 72 mg/dL (ref 70–99)
Potassium: 5.8 mmol/L — ABNORMAL HIGH (ref 3.5–5.1)
Sodium: 142 mmol/L (ref 135–145)

## 2020-02-22 LAB — ECHOCARDIOGRAM LIMITED
Height: 66 in
Weight: 3537.94 oz

## 2020-02-22 LAB — LACTIC ACID, PLASMA
Lactic Acid, Venous: 8.5 mmol/L (ref 0.5–1.9)
Lactic Acid, Venous: 7 mmol/L (ref 0.5–1.9)
Lactic Acid, Venous: 6.4 mmol/L (ref 0.5–1.9)

## 2020-02-22 LAB — BPAM RBC
Blood Product Expiration Date: 202106232359
Blood Product Expiration Date: 202106232359
ISSUE DATE / TIME: 202105261729
ISSUE DATE / TIME: 202105261954
Unit Type and Rh: 5100
Unit Type and Rh: 5100

## 2020-02-22 LAB — GLUCOSE, CAPILLARY
Glucose-Capillary: 110 mg/dL — ABNORMAL HIGH (ref 70–99)
Glucose-Capillary: 149 mg/dL — ABNORMAL HIGH (ref 70–99)
Glucose-Capillary: 55 mg/dL — ABNORMAL LOW (ref 70–99)
Glucose-Capillary: 60 mg/dL — ABNORMAL LOW (ref 70–99)
Glucose-Capillary: 64 mg/dL — ABNORMAL LOW (ref 70–99)
Glucose-Capillary: 69 mg/dL — ABNORMAL LOW (ref 70–99)
Glucose-Capillary: 86 mg/dL (ref 70–99)
Glucose-Capillary: 87 mg/dL (ref 70–99)
Glucose-Capillary: 93 mg/dL (ref 70–99)

## 2020-02-22 LAB — HEMOGLOBIN AND HEMATOCRIT, BLOOD
HCT: 27.7 % — ABNORMAL LOW (ref 39.0–52.0)
Hemoglobin: 9.3 g/dL — ABNORMAL LOW (ref 13.0–17.0)

## 2020-02-22 LAB — MAGNESIUM
Magnesium: 1.5 mg/dL — ABNORMAL LOW (ref 1.7–2.4)
Magnesium: 1.5 mg/dL — ABNORMAL LOW (ref 1.7–2.4)

## 2020-02-22 LAB — URIC ACID
Uric Acid, Serum: 16.7 mg/dL — ABNORMAL HIGH (ref 3.7–8.6)
Uric Acid, Serum: 16.5 mg/dL — ABNORMAL HIGH (ref 3.7–8.6)

## 2020-02-22 LAB — PROCALCITONIN: Procalcitonin: 12.09 ng/mL

## 2020-02-22 LAB — PROTIME-INR
INR: 1.7 — ABNORMAL HIGH (ref 0.8–1.2)
Prothrombin Time: 19 seconds — ABNORMAL HIGH (ref 11.4–15.2)

## 2020-02-22 LAB — TSH: TSH: 3.068 u[IU]/mL (ref 0.350–4.500)

## 2020-02-22 LAB — AMMONIA: Ammonia: 57 umol/L — ABNORMAL HIGH (ref 9–35)

## 2020-02-22 LAB — HIV ANTIBODY (ROUTINE TESTING W REFLEX): HIV Screen 4th Generation wRfx: NONREACTIVE

## 2020-02-22 LAB — CORTISOL: Cortisol, Plasma: 46.2 ug/dL

## 2020-02-22 LAB — LACTATE DEHYDROGENASE: LDH: 3367 U/L — ABNORMAL HIGH (ref 98–192)

## 2020-02-22 LAB — MRSA PCR SCREENING: MRSA by PCR: NEGATIVE

## 2020-02-22 LAB — CK: Total CK: 393 U/L (ref 49–397)

## 2020-02-22 LAB — PHOSPHORUS
Phosphorus: 6.3 mg/dL — ABNORMAL HIGH (ref 2.5–4.6)
Phosphorus: 6.1 mg/dL — ABNORMAL HIGH (ref 2.5–4.6)
Phosphorus: 6.4 mg/dL — ABNORMAL HIGH (ref 2.5–4.6)

## 2020-02-22 MED ORDER — ALBUMIN HUMAN 25 % IV SOLN
12.5000 g | Freq: Four times a day (QID) | INTRAVENOUS | Status: AC
  Administered 2020-02-22 (×3): 12.5 g via INTRAVENOUS
  Filled 2020-02-22 (×3): qty 50

## 2020-02-22 MED ORDER — DEXTROSE 50 % IV SOLN
INTRAVENOUS | Status: AC
  Administered 2020-02-22: 50 mL
  Filled 2020-02-22: qty 50

## 2020-02-22 MED ORDER — MAGNESIUM SULFATE 2 GM/50ML IV SOLN
2.0000 g | Freq: Once | INTRAVENOUS | Status: AC
  Administered 2020-02-22: 2 g via INTRAVENOUS
  Filled 2020-02-22: qty 50

## 2020-02-22 MED ORDER — SODIUM POLYSTYRENE SULFONATE 15 GM/60ML PO SUSP: 15.0000 g | Freq: Once | ORAL | Status: DC

## 2020-02-22 MED ORDER — SODIUM ZIRCONIUM CYCLOSILICATE 5 G PO PACK
10.0000 g | PACK | Freq: Two times a day (BID) | ORAL | Status: DC
  Administered 2020-02-22 – 2020-02-23 (×2): 10 g via ORAL
  Filled 2020-02-22 (×2): qty 2

## 2020-02-22 MED ORDER — VANCOMYCIN HCL 1250 MG/250ML IV SOLN
1250.0000 mg | INTRAVENOUS | Status: DC
  Administered 2020-02-22: 1250 mg via INTRAVENOUS
  Filled 2020-02-22: qty 250

## 2020-02-22 MED ORDER — ACETAMINOPHEN 160 MG/5ML PO SOLN
650.0000 mg | Freq: Once | ORAL | Status: AC
  Administered 2020-02-22: 650 mg via ORAL
  Filled 2020-02-22: qty 20.3

## 2020-02-22 MED ORDER — SODIUM CHLORIDE 0.9 % IV BOLUS
2000.0000 mL | Freq: Once | INTRAVENOUS | Status: AC
  Administered 2020-02-22: 2000 mL via INTRAVENOUS

## 2020-02-22 MED ORDER — CHLORHEXIDINE GLUCONATE 0.12% ORAL RINSE (MEDLINE KIT)
15.0000 mL | Freq: Two times a day (BID) | OROMUCOSAL | Status: DC
  Administered 2020-02-22 – 2020-02-23 (×3): 15 mL via OROMUCOSAL

## 2020-02-22 MED ORDER — CHLORHEXIDINE GLUCONATE CLOTH 2 % EX PADS
6.0000 | MEDICATED_PAD | Freq: Every day | CUTANEOUS | Status: DC
  Administered 2020-02-22: 6 via TOPICAL

## 2020-02-22 MED ORDER — SODIUM CHLORIDE 0.9 % IV BOLUS
1000.0000 mL | Freq: Once | INTRAVENOUS | Status: AC
  Administered 2020-02-22: 1000 mL via INTRAVENOUS

## 2020-02-22 MED ORDER — SODIUM BICARBONATE-DEXTROSE 150-5 MEQ/L-% IV SOLN
150.0000 meq | INTRAVENOUS | Status: DC
  Administered 2020-02-22 – 2020-02-23 (×4): 150 meq via INTRAVENOUS
  Filled 2020-02-22 (×5): qty 1000

## 2020-02-22 MED ORDER — FAMOTIDINE IN NACL 20-0.9 MG/50ML-% IV SOLN
20.0000 mg | INTRAVENOUS | Status: DC
  Administered 2020-02-22 – 2020-02-23 (×2): 20 mg via INTRAVENOUS
  Filled 2020-02-22 (×2): qty 50

## 2020-02-22 MED ORDER — SODIUM CHLORIDE 0.9 % IV SOLN
6.0000 mg | Freq: Once | INTRAVENOUS | Status: AC
  Administered 2020-02-22: 6 mg via INTRAVENOUS
  Filled 2020-02-22: qty 4

## 2020-02-22 MED ORDER — FENTANYL 2500MCG IN NS 250ML (10MCG/ML) PREMIX INFUSION
0.0000 ug/h | INTRAVENOUS | Status: DC
  Administered 2020-02-22: 200 ug/h via INTRAVENOUS
  Filled 2020-02-22: qty 250

## 2020-02-22 MED ORDER — SODIUM CHLORIDE 0.9 % IV SOLN
2.0000 g | Freq: Two times a day (BID) | INTRAVENOUS | Status: DC
  Administered 2020-02-22 – 2020-02-23 (×3): 2 g via INTRAVENOUS
  Filled 2020-02-22 (×3): qty 2

## 2020-02-22 MED ORDER — ORAL CARE MOUTH RINSE
15.0000 mL | OROMUCOSAL | Status: DC
  Administered 2020-02-22 – 2020-02-23 (×13): 15 mL via OROMUCOSAL

## 2020-02-22 MED ORDER — HEPARIN SODIUM (PORCINE) 5000 UNIT/ML IJ SOLN
5000.0000 [IU] | Freq: Three times a day (TID) | INTRAMUSCULAR | Status: DC
  Administered 2020-02-22 – 2020-02-23 (×3): 5000 [IU] via SUBCUTANEOUS
  Filled 2020-02-22 (×3): qty 1

## 2020-02-22 MED ORDER — CALCIUM GLUCONATE-NACL 1-0.675 GM/50ML-% IV SOLN
1.0000 g | Freq: Once | INTRAVENOUS | Status: AC
  Administered 2020-02-22: 1000 mg via INTRAVENOUS
  Filled 2020-02-22: qty 50

## 2020-02-22 MED ORDER — VANCOMYCIN HCL 2000 MG/400ML IV SOLN
2000.0000 mg | Freq: Once | INTRAVENOUS | Status: AC
  Administered 2020-02-22: 2000 mg via INTRAVENOUS
  Filled 2020-02-22: qty 400

## 2020-02-22 MED ORDER — LACTULOSE 10 GM/15ML PO SOLN
20.0000 g | Freq: Two times a day (BID) | ORAL | Status: DC
  Administered 2020-02-22 – 2020-02-23 (×3): 20 g
  Filled 2020-02-22 (×3): qty 30

## 2020-02-22 MED ORDER — SODIUM CHLORIDE 0.9 % IV SOLN
2.0000 g | Freq: Once | INTRAVENOUS | Status: AC
  Administered 2020-02-22: 2 g via INTRAVENOUS
  Filled 2020-02-22: qty 2

## 2020-02-22 MED ORDER — DOCUSATE SODIUM 50 MG/5ML PO LIQD
100.0000 mg | Freq: Two times a day (BID) | ORAL | Status: DC
  Administered 2020-02-23: 100 mg
  Filled 2020-02-22: qty 10

## 2020-02-22 NOTE — Progress Notes (Cosign Needed)
NAME:  Eric Houston, MRN:  [id number], DOB:  Nov 27, 1955, LOS: 1 ADMISSION DATE:  02/15/2020, CONSULTATION DATE:  02/22/2020 REFERRING MD: Dr. Alvino Chapel CHIEF COMPLAINT:  Respiratory Failure  Brief History   64 year old male found unresponsive in front yard who suffered a respiratory arrest after arrival to ED on 02/18/2020. CT Abdomen/Pelvis showed large mesenteric mass with numerous hepatic masses consistent with metastatic disease. He is now intubated and lightly sedated in the ICU with acute kidney injury, acute liver injury, and concern for tumor lysis syndrome.   History of present illness   64 year old male with a PMH significant for OSA, HTN, DM II, COPD, and hydrocephalus s/p VP shunt who was found laying in the yard by his son for approximately 4 hours on 02/22/2020. He was brought to the ED where he was initially able to answer questions and reported that he became dizzy, fell, and was unable to get himself up.  Shortly after arrival to the ED, he acutely decompensated and was noted to be unresponsive and hypoxic requiring emergent intubation. He was noted to be acutely hypotensive and required vasopressor support. CT Abdomen/Pelvis revealed a large mesenteric mass (21.6 x 19 cm and extending 22.5 cm craniocaudal) within the left mid-abdomen, numerous large hepatic masses consistent with metastatic disease and large volume ascites.  Overnight, patient received approximately 8L in IVF with 68mL of urine output. Clinically, there was suspicion overnight for development of tumor lysis syndrome and nephrology was consulted for possible CRRT.    Past Medical History  OSA HTN DM II COPD Hydrocephalus s/p VP shunt Arthritis Former smoker  [place] [facility] Events   5/26 Admitted to ICU  Consults:  5/27 Nephrology  Procedures:  5/26 CVC >> 5/26 ETT >> 5/26 Foley catheter >>  Significant Diagnostic Tests:  5/26 CXR >> No acute intrathoracic process. No evidence of PTX, effusion,  or airspace disease. 5/26 CT Abdomen/Pelvis >> Large mesenteric mass in left mid-abdomen measuring 21.6 x 19 cm and extending 22.5 cm craniocaudal. Innumerable hepatic metastases. Large volume ascites 5/26 CT Head WO >> Chronic age-related changes. Interhemispheric cyst. No acute abnormalities 5/26 CT C-Spine >> Degenerative changes but no acute abnormalities   Micro Data:  5/27 BC >> NGTD 5/26 SARS CoV2 >> Negative 5/26 HIV >> Negative   Antimicrobials:  5/27 Vancomycin >> 5/27 Cefepime >>  Interim history/subjective:  5/27 Critical condition. Was started on HCO3 drip overnight for worsening lactic acidosis and acute renal failure. Labs revealed hyperkalemia and hyperuricemia and nephrology consulted for possible TLS and need for CRRT.    Objective   Blood pressure 103/63, pulse (!) 115, temperature (!) 101.7 F (38.7 C), resp. rate (!) 26, height 5\' 6"  (1.676 m), weight 100.3 kg, SpO2 97 %. CVP:  [3 mmHg] 3 mmHg  Vent Mode: PRVC FiO2 (%):  [40 %-100 %] 40 % Set Rate:  [18 bmp-26 bmp] 26 bmp Vt Set:  [510 mL] 510 mL PEEP:  [5 cmH20-8 cmH20] 5 cmH20 Plateau Pressure:  [18 cmH20-19 cmH20] 18 cmH20   Intake/Output Summary (Last 24 hours) at 02/22/2020 0742 Last data filed at 02/22/2020 0500 Gross per 24 hour  Intake 8106.69 ml  Output 10 ml  Net 8096.69 ml   Filed Weights   02/04/2020 1433 02/22/20 0241  Weight: 95.3 kg 100.3 kg    Examination: General: Chronically-ill appearing white male laying in bed on mechanical ventilation HENT: NCAT. Non-icteric sclera. No cervical lymphadenopathy. 8.0 ETT in place. Scant dried blood in oropharynx. LIJ CVC  c/d/i.  Lungs: Equal chest rise and fall on mechanical ventilation. No accessory muscle use. CTAB.  Cardiovascular: Sinus tachycardia on monitor. 3+ deep pitting edema to bilateral lower extremities. No murmurs, rubs, or gallops.  Abdomen: Distended and firm. Tympanic. Tenderness noted to deep palpation Neuro: Briefly wakes to  voice. Follows commands in all four extremities. PERRL. GCS 10T. GU: Foley catheter in place. Scant clear and yellow urine in foley bag. Extremities: Calves edematous but soft and compressible. Normal muscle tone and bulk for age. No obvious joint deformity.  Skin: Pale, cool, and dry. Scattered telangiectatic vessels throughout bilateral lower extremities and feet.  Resolved Hospital Problem list     Assessment & Plan:   Acute hypoxic respiratory failure CXR and CT Chest without any evidence of acute airspace disease, PTX, or effusions. Consider possibility of PE with underlying malignancy, however, no symptoms of PE noted at time of respiratory collapse. Currently tolerating PS/CPAP trial with minimal support.  P: -Continue current lung protective MV strategy -6-96mL/kg IBW TV -Plateau pressure < 30cmH20 -SBT as tolerated -goal: Wean and progress toward extubate dependent on mentation  -VAP prevention bundle  -Acquiring bilateral LE dopplers to rule-out DVT -ECHO   Shock, undifferentiated Likely hypovolemic vs distributive shock P: -Received large volume resuscitation + 2 PRBC -Norepinephrine for vasopressor support to maintain MAP > 65 -ECHO for structural abnormalities and volume status -Albumin given for liver injury, likely third-spacing fluid in the setting of hypoalbuminemia.  Undiagnosed malignancy CT Abdomen/Pelvis revealed large mesenteric mass with innumerable hepatic masses. Unknown prior to hospitalization. P:  -Supportive care. Will need Hem/Onc consult if survives current illness.  -Goals of care discussion when able to reach family.   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 Undiagnosed malignancy with large tumor burden. P: -Continuous telemetry monitoring in ICU -Allergic to allopurinol with SJS reaction.  -Will order rasburicase which will convert excess uric acid into allantoin for  excretion -Trend electrolytes and correct as able.   Acute kidney injury AKI with oliguria, Cr 1.94. Etiology likely multifactorial with dehydration/shock leading to ATN. Doubt obstructive etiology given absence of hydronephrosis on CT.    P: -Consulted nephrology for possible CRRT. No indication for CRRT at present per nephrology.  -HCO3 drip -Foley catheter with strict I/O  -Trend renal indices  Acute liver injury Coagulopathy Hypoalbuminemia Hyperammonemia  Abdominal ascites Significant increase in AST/ALT from initial presenting lab work. Likely component of shock liver. Bladder pressure 22. Suspect elevation due to large tumor burden and ascites.  P: -Trend LFT's -Minimize hepatoxic medications -Supportive care -Lactulose BID -Based on CT Abdomen/Pelvis. No clear window to safely perform therapeutic paracentesis. Therefore, careful consideration of fluid administration to prevent further contribution to fluid burden.   AGMA, Lactic Acidosis Improving on HCO3 drip. Etiology likely multifactorial from tumor lysis syndrome, AKI, and shock liver. Possible septic component. P: -Received large volume resuscitation -Continue HCO3 drip.   Hypomagnesemia Hyperkalemia Hyperphosphatemia P: -Replete with Magnesium 2g IV -Recheck magnesium/phos and potassium level tomorrow morning.   Leukocytosis Concern for infectious / septic process in the setting of fever versus malignancy P: -Continue vancomycin and cefepime -Await culture data  Normocytic anemia -Likely anemia of chronic origin versus bone marrow suppression in setting of undiagnosed malignancy. No evidence for acute blood loss anemia.  P: -Stable. Received 2 units PRBC's overnight for Hgb 5.8 with increase to 8.8.  -Continue to monitor for signs and symptoms of bleeding. -Recheck CBC tomorrow AM or with new  concern for bleeding.    Best practice:  Diet: NPO Pain/Anxiety/Delirium protocol (if indicated): Fentanyl  drip / Versed PRN VAP protocol (if indicated): Yes DVT prophylaxis:  Adding Heparin SQ GI prophylaxis: Adding H2 antagonist Glucose control: No current indication. Maintain glucose < 180mg /dL. Mobility: Bedrest Code Status: Full Family Communication: Multiple unsuccessful attempts to communicate with family. Social work consulted for assistance. Disposition: ICU  Labs   CBC: Recent Labs  Lab 02/20/2020 1535 01/31/2020 1535 02/15/2020 1657 02/26/2020 1830 02/22/20 0023 02/22/20 0144 02/22/20 0202  WBC 21.2*  --   --   --   --   --  36.3*  NEUTROABS 14.8*  --   --   --   --   --   --   HGB 6.9*   < > 5.8* 6.5* 9.3* 8.8* 8.8*  HCT 24.1*   < > 17.0* 19.0* 27.7* 26.0* 26.6*  MCV 102.6*  --   --   --   --   --  90.8  PLT 286  --   --   --   --   --  244   < > = values in this interval not displayed.    Basic Metabolic Panel: Recent Labs  Lab 01/30/2020 1657 02/04/2020 1830 02/04/2020 2310 02/22/2020 2311 02/22/20 0144 02/22/20 0202  NA 140 140  --  142 138 139  K 3.5 3.8  --  5.8* 5.1 5.2*  CL  --   --   --  107  --  107  CO2  --   --   --  14*  --  16*  GLUCOSE  --   --   --  72  --  86  BUN  --   --   --  54*  --  47*  CREATININE  --   --   --  1.85*  --  1.94*  CALCIUM  --   --   --  9.6  --  9.1  MG  --   --  1.5*  --   --  1.5*  PHOS  --   --  6.3*  --   --  6.1*   GFR: Estimated Creatinine Clearance: 42.7 mL/min (A) (by C-G formula based on SCr of 1.94 mg/dL (H)). Recent Labs  Lab 02/18/2020 1535 02/22/2020 2310 02/22/20 0202 02/22/20 0436  PROCALCITON  --  12.09  --   --   WBC 21.2*  --  36.3*  --   LATICACIDVEN  --  8.5* 7.0* 6.4*    Liver Function Tests: Recent Labs  Lab 02/20/2020 1535 02/22/20 0202  AST 145* 1,695*  ALT 46* 444*  ALKPHOS 191* 182*  BILITOT 0.9 1.6*  PROT 4.9* 4.5*  ALBUMIN 1.9* 1.8*   No results for input(s): LIPASE, AMYLASE in the last 168 hours. Recent Labs  Lab 02/07/2020 2312  AMMONIA 57*    ABG    Component Value Date/Time    PHART 7.287 (L) 02/22/2020 0144   PCO2ART 31.3 (L) 02/22/2020 0144   PO2ART 150 (H) 02/22/2020 0144   HCO3 14.9 (L) 02/22/2020 0144   TCO2 16 (L) 02/22/2020 0144   ACIDBASEDEF 11.0 (H) 02/22/2020 0144   O2SAT 99.0 02/22/2020 0144     Coagulation Profile: Recent Labs  Lab 02/20/2020 2312  INR 1.7*    Cardiac Enzymes: Recent Labs  Lab 02/24/2020 2311  CKTOTAL 393    HbA1C: Hgb A1c MFr Bld  Date/Time Value Ref Range Status  08/09/2016 05:02 PM  5.7 (H) 4.8 - 5.6 % Final    Comment:    (NOTE)         Pre-diabetes: 5.7 - 6.4         Diabetes: >6.4         Glycemic control for adults with diabetes: <7.0   12/05/2009 10:50 PM 5.7 4.6 - 6.1 % Final    Comment:    See lab report for associated comment(s)    CBG: Recent Labs  Lab 02/26/2020 1514 02/22/20 0019 02/22/20 0046 02/22/20 0333 02/22/20 0431  GLUCAP 127* 55* 149* 60* 110*    Review of Systems:   Unable to obtain secondary to intubation and sedation  Past Medical History  He,  has a past medical history of Arthritis, Asthmatic bronchitis, COPD (chronic obstructive pulmonary disease) ([place]), Diabetes mellitus type II, Headache, Hydrocephalus ([place]), Hypertension, Shortness of breath dyspnea, and Sleep apnea.   Surgical History    Past Surgical History:  Procedure Laterality Date  . neck fusion    . TONSILLECTOMY    . VENTRICULOPERITONEAL SHUNT     x 2     Social History   reports that he quit smoking about 35 years ago. His smoking use included cigarettes. He has a 4.00 pack-year smoking history. He has never used smokeless tobacco. He reports current alcohol use of about 4.0 standard drinks of alcohol per week. He reports that he does not use drugs.   Family History   His family history includes Cancer in his mother; Diabetes in his brother, father, and mother; Emphysema in his father; Heart attack in his father; Heart disease in his father and mother; Stroke in his mother.   Allergies Allergies  Allergen  Reactions  . 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 Sulfa Antibiotics     rash     Home Medications  Prior to Admission medications   Medication Sig Start Date End Date Taking? Authorizing Provider  losartan (COZAAR) 50 MG tablet Take 50 mg by mouth daily. 07/25/16  Yes [provider]  albuterol (VENTOLIN HFA) 108 (90 BASE) MCG/ACT inhaler Inhale 2 puffs into the lungs every 4 (four) hours as needed.     [provider]  atorvastatin (LIPITOR) 20 MG tablet Take 1 tablet (20 mg total) by mouth daily. 07/10/16 01/26/20  End, Harrell Gave, MD  budesonide-formoterol (SYMBICORT) 160-4.5 MCG/ACT inhaler Inhale 2 puffs into the lungs 2 (two) times daily. Patient not taking: Reported on 09/25/2017 09/26/15   Barton Dubois, MD  buPROPion [facility] SR) 100 MG 12 hr tablet Take 100 mg by mouth daily. 07/29/17   [provider]  cyclobenzaprine (FLEXERIL) 10 MG tablet Take 10 mg by mouth 3 (three) times daily as needed for muscle spasms. 12/28/19   [provider]  guaiFENesin-dextromethorphan (ROBITUSSIN DM) 100-10 MG/5ML syrup Take 5 mLs by mouth every 4 (four) hours as needed for cough. 09/26/15   Barton Dubois, MD  insulin aspart (NOVOLOG) 100 UNIT/ML injection Inject 0-5 Units into the skin at bedtime. Patient not taking: Reported on 09/25/2017 08/12/16   Geradine Girt, DO  insulin aspart (NOVOLOG) 100 UNIT/ML injection Inject 0-9 Units into the skin 3 (three) times daily with meals. Patient not taking: Reported on 09/25/2017 08/12/16   Geradine Girt, DO  losartan-hydrochlorothiazide (HYZAAR) 100-25 MG tablet Take 1 tablet by mouth daily. 02/04/20   [provider]  metFORMIN (GLUCOPHAGE) 500 MG tablet Take 500 mg by mouth daily  after breakfast. 08/28/16   [provider]  oxyCODONE-acetaminophen (PERCOCET) 10-325 MG tablet Take 10-325 tablets by mouth every 8 (eight) hours as needed for pain.  06/30/16   [provider]  potassium chloride SA (KLOR-CON) 20 MEQ tablet Take 20 mEq by mouth daily. 11/27/19   [provider]  sertraline (ZOLOFT) 100 MG tablet Take 100 mg by mouth daily. 07/29/17   [provider]     Critical care time: 80 minutes    Electronically signed by: Mamie Nick. Adonis Brook, AGACNP-S 02/22/20 10:27

## 2020-02-22 NOTE — Progress Notes (Addendum)
PCCM Family communication note   Family note-- brother Esperanza Sheets to bedside. Clinical course discussed. Code status, GOC discussed. Family too overwhelmed to make decisions at this time. _________________________________________  Harriette Ohara, arrived at bedside. He describes his father as an independent man who strongly dislikes hospitals and doctors and was believed to be healthy-- this is likely a large reason why care was not sought sooner for abdominal mass, pt believed this was adiposity.   I discussed clinical course with son. We reviewed CT a/p, discussing findings concerning for malignancy. We discussed the patient's critical illness, with shock on pressors + MODS including acute renal failure and acute liver injury, hypoxic respiratory failure on mechanical ventilation. We discussed concern for TLS. We discussed potential for RRT if ARF does not improve.  We discussed guarded prognosis for current critical illness course, as well as concern for underlying malignancy.   Patient's son is understandably overwhelmed by current illness course. He wished to discuss the clinical scenario with his brother, Esperanza Sheets, before making decisions regarding care. He has now left the hospital to bring [facility] to bedside.    Contact information:  Esperanza Sheets (son)- [phone] (use this number first as Erlene Quan states his phone may be disconnected) alt contact for Erlene Quan is his father's phone [phone] Erlene Quan (son) - [phone]

## 2020-02-22 NOTE — Progress Notes (Signed)
Hypoglycemic Event  CBG: 55  Treatment: Amp of D50  Symptoms: none  Follow-up CBG: Time: 0050 CBG Result: 149  Possible Reasons for Event: NPO    Antonietta Breach

## 2020-02-22 NOTE — Progress Notes (Signed)
CSW made several attempts to locate patient's family members. The patient's son was in the ED yesterday but contact information was not obtained. Per EMS sheet, the 911 caller was Elon Spanner. CSW attempted to locate Elon Spanner via [place] but was unsuccessful with attempts to speak with that individual.   CSW spoke with staff at non-emergent [facility] to complete a wellness check at the patient's address in attempts to locate his son.   Madilyn Fireman, MSW, LCSW-A Transitions of Care  Clinical Social Worker  [facility] Emergency Departments  Medical ICU [phone]

## 2020-02-22 NOTE — Progress Notes (Signed)
Pharmacy Antibiotic Note  Eric Houston is a 64 y.o. male admitted on 01/30/2020 with AMS, experienced respiratory arrest in ED >> concern for sepsis.  Pharmacy has been consulted for vancomycin and cefepime dosing.  Plan: Vancomycin 2000mg  x1 then 1250mg  IV every 24 hours.  Goal trough 15-20 mcg/mL.  Cefepime 2g IV Q12H.  Height: 5\' 6"  (167.6 cm) Weight: 95.3 kg (210 lb) IBW/kg (Calculated) : 63.8  Temp (24hrs), Avg:96.3 F (35.7 C), Min:94.6 F (34.8 C), Max:98.8 F (37.1 C)  Recent Labs  Lab 02/11/2020 1535 02/09/2020 2310 01/31/2020 2311  WBC 21.2*  --   --   CREATININE  --   --  1.85*  LATICACIDVEN  --  8.5*  --     Estimated Creatinine Clearance: 43.6 mL/min (A) (by C-G formula based on SCr of 1.85 mg/dL (H)).    Allergies  Allergen Reactions  . 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 Sulfa Antibiotics     rash    Thank you for allowing pharmacy to be a part of this patient's care.  Wynona Neat, PharmD, BCPS  02/22/2020 1:23 AM

## 2020-02-22 NOTE — Significant Event (Signed)
Lactic Acid 8.5. Crt 1.85. K 5.8. Patient with no urinary output. Obtained CVP which is 3. Increased bicarb gtt to 125 ml/hr. Will Give 2 additional liters of fluid now.  Repeat Lactic Order. Improving pH. If no improvement in lactic acid and metabolic state will need nephrology consult.

## 2020-02-22 NOTE — Progress Notes (Addendum)
NAME:  Eric Houston, MRN:  [id number], DOB:  May 28, 1956, LOS: 1 ADMISSION DATE:  02/02/2020, CONSULTATION DATE:  02/14/2020 REFERRING MD:  Dr. Alvino Chapel, CHIEF COMPLAINT:  Respiratory arrest/ shock  Brief History   64 yoM who was found laying in yard, reportedly for over 4 hours after complaints of dizziness/ weakness who acutely decompensated in ER with altered mental status and respiratory arrest, now intubated with shock and profound acidosis.  CT of abd/ pelvis concerning for diffuse metastatic process, origin unclear.    History of present illness   HPI obtained from medical chart review as patient is unresponsive, intubated, and mechanical ventilation.  Family not able to be found or reached by phone at this time.   64 year old male with prior history of COPD, DMT2, HA, hydrocephalus with prior VP shunt, HTN, OSA, arthritis, and former smoker presenting to ER by EMS after patient was found lying in the yard by his son.  Patient reported he got dizzy and fell and couldn't get up and had been laying there for around 4 hours.  He was initially able to answer questions,  alert, slightly tachycardic otherwise hemodynamically stable on arrival to ER.  Shortly after, staff were called into room where he acutely decompensated, was unresponsive and hypoxic requiring emergent intubation.  Afterwards he became acutely hypotensive requiring vasopressor support.  Workup noted for WBC 21.2, Hgb 6.9,  Alk phos 191, albumin 1.9, AST 145, ALT 46, SARS2 negative, foley placed but no urine output thus far, BMET pending.  2 units of PRBC ordered along with bicarb gtt.  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  He has remained unresponsive, not requiring sedation.  PCCM called for admit.   Of note, patient had  recent ER evaluation on 01/26/2020 for generalized weakness and reported syncopal episodes/ falls.  Workup was unremarkable except for a Hgb of 9 and discharged home with patient to follow up outpatient.    Past Medical History  COPD, DMT2, HA, hydrocephalus with prior VP shunt, HTN, OSA, arthritis, former smoker   Significant Hospital Events   5/26 Admit  Consults:   Procedures:  5/26 ETT >> 5/26 Foley >> 5/26 CVC>>  Significant Diagnostic Tests:  5/26 CT chest/ a/p >> 1. Large mesenteric mass within the left mid abdomen, compatible with malignancy of uncertain etiology. 2. Innumerable hepatic metastases. 3. Mesenteric nodularity consistent with mesenteric implants. 4. Large volume ascites.  No evidence of intra-abdominal hemorrhage. 5. Right mainstem intubation. Recommend retracting endotracheal tube 3 cm. Otherwise no acute intrathoracic process.  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 No acute intracranial abnormality is noted.  CT of the cervical spine: Multilevel degenerative change and postoperative change without acute abnormality  Micro Data:  5/26 SARS2 >> neg 5/26  BCx2 >> 5/26 UC >>  Antimicrobials:  5/26 zosyn  5/26 cefepime>> 5/26 vancomycin  >>  Interim history/subjective:  Overnight has progressively decompensated.  Has been anuric, with increasing uric acid which is concerning for TLS. 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 Nephrology consulted but rec to hold off of CRRT for now.   Cooling blanket ordered for fever with hepatic injury   Many attempts to engage family have not been successful. Number listed is not in service  Objective   Blood  pressure 103/63, pulse (!) 115, temperature (!) 101.7 F (38.7 C), resp. rate (!) 26, height '5\' 6"'$  (1.676 m), weight 100.3 kg, SpO2 97 %. CVP:  [3 mmHg] 3 mmHg  Vent Mode: PRVC FiO2 (%):  [40 %-100 %] 40 % Set Rate:  [18 bmp-26 bmp] 26 bmp Vt Set:  [510  mL] 510 mL PEEP:  [5 cmH20-8 cmH20] 5 cmH20 Plateau Pressure:  [18 cmH20-19 cmH20] 18 cmH20   Intake/Output Summary (Last 24 hours) at 02/22/2020 0739 Last data filed at 02/22/2020 0500 Gross per 24 hour  Intake 8106.69 ml  Output 10 ml  Net 8096.69 ml   Filed Weights   02/07/2020 1433 02/22/20 0241  Weight: 95.3 kg 100.3 kg   Examination: General:  Critically ill and chronically ill appearing M, appears older than stated age, intubated sedated NAD  Neuro: Sedated at time of exam, awakens to tactile stimulation, weakly follows commands intermittently before falling asleep. PERRL 41m  HEENT: Temporal muscle wasting. ETT secure. Anicteric sclera. Trachea midline  CV: tachycardic rate rr. s1s2 no rgm. Cap refill < 3 seconds, 1+ radial pulses  PULM:  CTA bilat. No accessory muscle use on PSV/CPAP . Symmetrical chest expansion  GI: Distended. Distant bowel sounds. Mild diffuse tenderness without rebound tenderness.  GU: foley without urine collection.  Extremities: BLE 3+ edema. No obvious joint deformity. No clubbing  Skin: Pale, warm. RLE with skin shearing   Resolved Hospital Problem list    Assessment & Plan:   Acute encephalopathy, likely toxic metabolic  -suspect TLS in setting of large tumor burden, possible septic shock, likely component of HE with ammonia >50, and CNS meds for sedation P -correct metabolic abnormalities as able -wean sedation -lactulose  Acute hypoxic respiratory failure requiring intubation -sudden resp arrest in ED, did not have precceeding sx consistent with PE, however likely metastatic dz would put pt at greater risk. CT chest (not CTA) without evidence of PE but this imagine is suboptimal for evaluating  P -WUA/SBT, mechanics and oxygenation are sufficient -- need to ensure better mentation. -PAD, VAP  -BLE duplex ordered to r/o VTE (possible PE)  -ECHO   Shock -unclear etiology -concern for sepsis with leukocytosis and fever, however this could  be attributed to malignancy as well. 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  -allopurinol allergy (SJS)  -Uric acid 16.7, hyperkalemia, hyperphosphatemia, LDH 3367 P -trend cmp phos calcium uric acid  -rasburicase    Acute renal failure with anuria  -likely multifactorial etiology: ATN, prior ARB, likely TLS with elevated intraabdominal pressure -Possible that elevated intraabdominal pressure is reducing renal perfusion P -nephrology has been engaged, no firm lab indications for CRRT at this time  AGMA Lactic acidosis -AKI, possible TLS, possible sepsis P -cont sodium bicarb   Acute liver injury with hyperammonemia, coagulopathy, episodic hypoglycemia  -innumerable hepatic masses -possible component of shock liver as well  P -lactulose for hyperammonemia -consider correction of coagulopathy if further invasive procedure indicated or if volume needed for shock management  -trend LFTs   Elevated bladder pressure/ intraabdominal hypertension -22 -suspect multifactorial:  size of tumors  + ascites  -possible that tumor is compressing IVC  P -eval for possible safe window for therapeutic para. On CT, 1-2cm ascitic fluid in perihepatic region.  Hyperkalemia -ARF vs TLS. Improving on bicarb gtt Hyperphosphatemia -in setting of Acute renal failure vs TLS Hypomagnesemia  P -closely trend electrolytes -replace mag   Anemia, without obvious source of active bleeding -chronic disease vs marrow suppression in setting of malignancy -received 2  PRBC P -trend H/H -hgb goal > 7  Goals of Care :  -continue to attempt to contact family. Appreciate TOC assistance with securing contact information   Best practice:  Diet: EN per RDN Pain/Anxiety/Delirium protocol (if indicated): fentanyl/ versed  VAP protocol (if indicated): yes DVT prophylaxis: SQH  GI prophylaxis: PPI Glucose control: SSI if glucose > 180 Mobility: BR Code Status: Full  Family Communication: pending 5/27-- number listed is not in service.  Disposition: ICU  Labs   CBC: Recent Labs  Lab 02/09/2020 1535 02/13/2020 1535 02/03/2020 1657 02/01/2020 1830 02/22/20 0023 02/22/20 0144 02/22/20 0202  WBC 21.2*  --   --   --   --   --  36.3*  NEUTROABS 14.8*  --   --   --   --   --   --   HGB 6.9*   < > 5.8* 6.5* 9.3* 8.8* 8.8*  HCT 24.1*   < > 17.0* 19.0* 27.7* 26.0* 26.6*  MCV 102.6*  --   --   --   --   --  90.8  PLT 286  --   --   --   --   --  244   < > = values in this interval not displayed.    Basic Metabolic Panel: Recent Labs  Lab 02/20/2020 1657 01/28/2020 1830 02/05/2020 2310 02/11/2020 2311 02/22/20 0144 02/22/20 0202  NA 140 140  --  142 138 139  K 3.5 3.8  --  5.8* 5.1 5.2*  CL  --   --   --  107  --  107  CO2  --   --   --  14*  --  16*  GLUCOSE  --   --   --  72  --  86  BUN  --   --   --  54*  --  47*  CREATININE  --   --   --  1.85*  --  1.94*  CALCIUM  --   --   --  9.6  --  9.1  MG  --   --  1.5*  --   --  1.5*  PHOS  --   --  6.3*  --   --  6.1*   GFR: Estimated Creatinine Clearance: 42.7 mL/min (A) (by C-G formula based on SCr of 1.94 mg/dL (H)). Recent Labs  Lab 02/26/2020 1535 02/24/2020 2310 02/22/20 0202 02/22/20 0436  PROCALCITON  --  12.09  --   --   WBC 21.2*  --  36.3*  --   LATICACIDVEN  --  8.5* 7.0* 6.4*    Liver Function Tests: Recent Labs  Lab 02/03/2020 1535 02/22/20 0202  AST 145* 1,695*  ALT 46* 444*  ALKPHOS 191* 182*  BILITOT 0.9 1.6*  PROT 4.9* 4.5*  ALBUMIN 1.9* 1.8*   No results for input(s): LIPASE,  AMYLASE in the last 168 hours. Recent Labs  Lab 01/28/2020 2312  AMMONIA 57*    ABG    Component Value Date/Time   PHART  7.287 (L) 02/22/2020 0144   PCO2ART 31.3 (L) 02/22/2020 0144   PO2ART 150 (H) 02/22/2020 0144   HCO3 14.9 (L) 02/22/2020 0144   TCO2 16 (L) 02/22/2020 0144   ACIDBASEDEF 11.0 (H) 02/22/2020 0144   O2SAT 99.0 02/22/2020 0144     Coagulation Profile: Recent Labs  Lab 02/04/2020 2312  INR 1.7*    Cardiac Enzymes: Recent Labs  Lab 02/11/2020 2311  CKTOTAL 393    HbA1C: Hgb A1c MFr Bld  Date/Time Value Ref Range Status  08/09/2016 05:02 PM 5.7 (H) 4.8 - 5.6 % Final    Comment:    (NOTE)         Pre-diabetes: 5.7 - 6.4         Diabetes: >6.4         Glycemic control for adults with diabetes: <7.0   12/05/2009 10:50 PM 5.7 4.6 - 6.1 % Final    Comment:    See lab report for associated comment(s)    CBG: Recent Labs  Lab 02/20/2020 1514 02/22/20 0019 02/22/20 0046 02/22/20 0333 02/22/20 0431  GLUCAP 127* 55* 149* 60* 110*    CRITICAL CARE Performed by: Cristal Generous  Total critical care time: 60 minutes  Critical care time was exclusive of separately billable procedures and treating other patients. Critical care was necessary to treat or prevent imminent or life-threatening deterioration.  Critical care was time spent personally by me on the following activities: development of treatment plan with patient and/or surrogate as well as nursing, discussions with consultants, evaluation of patient's response to treatment, examination of patient, obtaining history from patient or surrogate, ordering and performing treatments and interventions, ordering and review of laboratory studies, ordering and review of radiographic studies, pulse oximetry and re-evaluation of patient's condition.   Eliseo Gum MSN, AGACNP-BC [place] [phone] If no answer, [phone] 02/22/2020, 9:25 AM

## 2020-02-22 NOTE — Progress Notes (Signed)
  Echocardiogram 2D Echocardiogram has been performed.  Jannett Celestine 02/22/2020, 2:14 PM

## 2020-02-22 NOTE — Progress Notes (Signed)
CRITICAL VALUE ALERT  Critical Value:  Lactic   Date & Time Notied:  0030 5/27   Provider Notified:  CCM NP  Orders Received/Actions taken: Orders given

## 2020-02-22 NOTE — Progress Notes (Signed)
Bilateral lower extremity venous duplex has been completed. Preliminary results can be found in CV Proc through chart review.   02/22/20 2:28 PM Eric Houston RVT

## 2020-02-22 NOTE — Consult Note (Signed)
Reason for Consult: Acute kidney injury, anuric Referring Physician: Kipp Brood MD (CCM)  HPI:  64 year old Caucasian man with past medical history significant for hypertension, type 2 diabetes mellitus, chronic obstructive lung disease, history of hydrocephalus status post VP shunt, osteoarthritis and seemingly normal renal function at baseline (creatinine 10/28/2019 was 1.1).  He was brought to the emergency room by EMS after found to be laying in his yard by his son; the patient reports to have apparently been dizzy and fallen and could not get up.  Upon arrival to the emergency room, he acutely decompensated becoming unresponsive and developing hypoxic respiratory failure requiring intubation along with hypotension requiring pressor support.  Work-up shows significant leukocytosis, anemia, evidence of large mesenteric mass within the left midabdomen with innumerable hepatic masses and large volume ascites.  He was acidemic/hyperkalemic and labs appear to be improving with bicarbonate drip.  He has received about 8L of fluids with only 10 cc of urine output overnight with notable increase in his abdominal distention.  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  He has been febrile and tachycardic overnight.  Review of his home medications indicates he was taking losartan, HCTZ, potassium chloride and Metformin prior to admission.  Past Medical History:  Diagnosis Date  . Arthritis   . Asthmatic bronchitis   . COPD (chronic obstructive pulmonary disease) ([place])   . Diabetes mellitus type II   . Headache   . Hydrocephalus ([facility])    vp shunt  . Hypertension   . Shortness of breath dyspnea   . Sleep apnea     Past Surgical History:  Procedure Laterality Date  . neck fusion    . TONSILLECTOMY    . VENTRICULOPERITONEAL SHUNT     x 2    Family History  Problem Relation Age of Onset  . Emphysema Father        smoked  .  Diabetes Father   . Heart disease Father   . Heart attack Father   . Cancer Mother        pancreatic  . Diabetes Mother   . Heart disease Mother        Born with hole in the heart  . Stroke Mother   . Diabetes Brother     Social History:  reports that he quit smoking about 35 years ago. His smoking use included cigarettes. He has a 4.00 pack-year smoking history. He has never used smokeless tobacco. He reports current alcohol use of about 4.0 standard drinks of alcohol per week. He reports that he does not use drugs.  Allergies:  Allergies  Allergen Reactions  . 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 Sulfa Antibiotics     rash    Medications:  Scheduled: . chlorhexidine gluconate (MEDLINE KIT)  15 mL Mouth Rinse BID  . Chlorhexidine Gluconate Cloth  6 each Topical Daily  . docusate  100 mg Oral BID  . lactulose  20 g Per Tube BID  . mouth rinse  15 mL Mouth Rinse 10 times per day  . pantoprazole (PROTONIX) IV  40 mg Intravenous Daily  . polyethylene glycol  17 g Oral Daily    BMP Latest Ref Rng & Units 02/22/2020 02/22/2020 02/13/2020  Glucose 70 - 99 mg/dL 86 - 72  BUN 8 - 23 mg/dL 47(H) - 54(H)  Creatinine 0.61 - 1.24 mg/dL 1.94(H) - 1.85(H)  Sodium 135 - 145 mmol/L 139 138 142  Potassium 3.5 - 5.1 mmol/L 5.2(H) 5.1 5.8(H)  Chloride 98 - 111 mmol/L 107 - 107  CO2 22 - 32 mmol/L 16(L) - 14(L)  Calcium 8.9 - 10.3 mg/dL 9.1 - 9.6   CBC Latest Ref Rng & Units 02/22/2020 02/22/2020 02/22/2020  WBC 4.0 - 10.5 K/uL 36.3(H) - -  Hemoglobin 13.0 - 17.0 g/dL 8.8(L) 8.8(L) 9.3(L)  Hematocrit 39.0 - 52.0 % 26.6(L) 26.0(L) 27.7(L)  Platelets 150 - 400 K/uL 244 - -    CT Head Wo Contrast  Result Date: 02/09/2020 CLINICAL DATA:  Recent fall with syncopal episode EXAM: CT HEAD WITHOUT CONTRAST CT CERVICAL SPINE WITHOUT CONTRAST TECHNIQUE: Multidetector CT imaging of the head and cervical spine was performed following the standard  protocol without intravenous contrast. Multiplanar CT image reconstructions of the cervical spine were also generated. 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 Bilateral ventriculostomy catheters are seen. Atrophic changes are noted. Mild encephalomalacia is again noted in the right frontal region. Stable interhemispheric cyst is noted. No findings to suggest acute hemorrhage, acute infarction or space-occupying mass lesion are noted. Vascular: No hyperdense vessel or unexpected calcification. Skull: Normal. Negative for fracture or focal lesion. Sinuses/Orbits: No acute finding. Other: None. CT CERVICAL SPINE FINDINGS Alignment: Within normal limits. Skull base and vertebrae: 7 cervical segments are well visualized. Changes consistent with prior fusion at C4-5 and C5-6 are seen with anterior fixation. Facet hypertrophic changes are noted. No acute fracture is seen. Multilevel osteophytic changes are seen. The odontoid is within normal limits. No acute fracture or acute facet abnormality is noted. Soft tissues and spinal canal: Surrounding soft tissue structures show endotracheal tube and gastric catheter in place. No acute soft tissue abnormality is noted. Shunt catheters are noted in the posterior soft tissues of the neck. Upper chest: Visualized lung apices are within normal limits. 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 No acute intracranial abnormality is noted. CT of the cervical spine: Multilevel degenerative change and postoperative change without acute abnormality. Electronically Signed   By: Inez Catalina M.D.   On: 02/14/2020 16:58   CT Chest W Contrast  Result Date: 02/09/2020 CLINICAL DATA:  Dizziness, collapse, abdominal pain, anemia EXAM: CT CHEST, ABDOMEN, AND PELVIS WITH CONTRAST TECHNIQUE: Multidetector CT imaging of the  chest, abdomen and pelvis was performed following the standard protocol during bolus administration of intravenous contrast. CONTRAST:  121m OMNIPAQUE IOHEXOL 300 MG/ML  SOLN COMPARISON:  None FINDINGS: CT CHEST FINDINGS Cardiovascular: The heart is unremarkable without pericardial effusion. Thoracic aorta is unremarkable without aneurysm or dissection. Mediastinum/Nodes: No pathologic mediastinal or hilar adenopathy. There is a right mainstem intubation, recommend retracting endotracheal tube 3 cm. Enteric catheter extends into the gastric lumen. Thyroid is unremarkable. Lungs/Pleura: No airspace disease, effusion, or pneumothorax. Central airways are patent. Musculoskeletal: There are no acute or destructive bony lesions. Reconstructed images demonstrate no additional findings. CT ABDOMEN PELVIS FINDINGS Hepatobiliary: There are numerous large hepatic masses consistent with metastatic disease. The largest is exophytic off of the inferior aspect right lobe liver measuring approximately 8.7 x 9.8 cm reference image 68/3. Calcified gallstones are identified without cholecystitis. Pancreas: Unremarkable. No pancreatic ductal dilatation or surrounding inflammatory changes. Spleen: Normal in size without focal abnormality. Adrenals/Urinary Tract: No urinary tract calculi or obstructive uropathy. No renal masses. The bladder is decompressed by  Foley catheter. Adrenals are normal. Stomach/Bowel: There is a large mass within the left mid abdomen measuring 21.6 x 19 cm, and extending 22.5 cm in craniocaudal length. The mass abuts and displaces numerous loops of bowel, without evidence of high-grade obstruction. Vascular/Lymphatic: Minimal atherosclerosis of the aorta. Abdominal vasculature enhances normally. I do not see any evidence of active contrast extravasation or vascular injury. Reproductive: Prostate is unremarkable. Other: There is large volume ascites throughout the abdomen and pelvis, without state attenuation  compatible with simple ascites. No evidence of intra-abdominal hemorrhage. There are numerous soft tissue nodule seen throughout the lower abdominal and pelvic mesentery consistent with mesenteric implants the soft tissue nodules can be seen within the right paracolic gutter on image 61, right lower quadrant image 108, and lower pelvis images 111-112. There is a right inguinal hernia containing ascites. No bowel herniation. Ventriculostomy catheter enters the abdomen in the central upper abdomen, tip within the right lower quadrant. No free intraabdominal gas. There is diffuse subcutaneous edema throughout the anterior abdominal wall. Musculoskeletal: No acute or destructive bony lesions. Reconstructed images demonstrate no additional findings. IMPRESSION: 1. Large mesenteric mass within the left mid abdomen, compatible with malignancy of uncertain etiology. 2. Innumerable hepatic metastases. 3. Mesenteric nodularity consistent with mesenteric implants. 4. Large volume ascites.  No evidence of intra-abdominal hemorrhage. 5. Right mainstem intubation. Recommend retracting endotracheal tube 3 cm. Otherwise no acute intrathoracic process. These results were called by telephone at the time of interpretation on 02/05/2020 at 5:09 pm to provider [facility] , who verbally acknowledged these results. Electronically Signed   By: Randa Ngo M.D.   On: 01/27/2020 17:09   CT Cervical Spine Wo Contrast  Result Date: 02/01/2020 CLINICAL DATA:  Recent fall with syncopal episode EXAM: CT HEAD WITHOUT CONTRAST CT CERVICAL SPINE WITHOUT CONTRAST TECHNIQUE: Multidetector CT imaging of the head and cervical spine was performed following the standard protocol without intravenous contrast. Multiplanar CT image reconstructions of the cervical spine were also generated. 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 Bilateral ventriculostomy  catheters are seen. Atrophic changes are noted. Mild encephalomalacia is again noted in the right frontal region. Stable interhemispheric cyst is noted. No findings to suggest acute hemorrhage, acute infarction or space-occupying mass lesion are noted. Vascular: No hyperdense vessel or unexpected calcification. Skull: Normal. Negative for fracture or focal lesion. Sinuses/Orbits: No acute finding. Other: None. CT CERVICAL SPINE FINDINGS Alignment: Within normal limits. Skull base and vertebrae: 7 cervical segments are well visualized. Changes consistent with prior fusion at C4-5 and C5-6 are seen with anterior fixation. Facet hypertrophic changes are noted. No acute fracture is seen. Multilevel osteophytic changes are seen. The odontoid is within normal limits. No acute fracture or acute facet abnormality is noted. Soft tissues and spinal canal: Surrounding soft tissue structures show endotracheal tube and gastric catheter in place. No acute soft tissue abnormality is noted. Shunt catheters are noted in the posterior soft tissues of the neck. Upper chest: Visualized lung apices are within normal limits. 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 No acute intracranial abnormality is noted. CT of the cervical spine: Multilevel degenerative change and postoperative change without acute abnormality. Electronically Signed   By: Inez Catalina M.D.   On: 02/25/2020 16:58   CT Abdomen Pelvis W Contrast  Result Date: 02/22/2020 CLINICAL DATA:  Dizziness, collapse, abdominal pain, anemia EXAM: CT  CHEST, ABDOMEN, AND PELVIS WITH CONTRAST TECHNIQUE: Multidetector CT imaging of the chest, abdomen and pelvis was performed following the standard protocol during bolus administration of intravenous contrast. CONTRAST:  147m OMNIPAQUE IOHEXOL 300 MG/ML  SOLN COMPARISON:  None FINDINGS: CT CHEST FINDINGS Cardiovascular: The heart is unremarkable without  pericardial effusion. Thoracic aorta is unremarkable without aneurysm or dissection. Mediastinum/Nodes: No pathologic mediastinal or hilar adenopathy. There is a right mainstem intubation, recommend retracting endotracheal tube 3 cm. Enteric catheter extends into the gastric lumen. Thyroid is unremarkable. Lungs/Pleura: No airspace disease, effusion, or pneumothorax. Central airways are patent. Musculoskeletal: There are no acute or destructive bony lesions. Reconstructed images demonstrate no additional findings. CT ABDOMEN PELVIS FINDINGS Hepatobiliary: There are numerous large hepatic masses consistent with metastatic disease. The largest is exophytic off of the inferior aspect right lobe liver measuring approximately 8.7 x 9.8 cm reference image 68/3. Calcified gallstones are identified without cholecystitis. Pancreas: Unremarkable. No pancreatic ductal dilatation or surrounding inflammatory changes. Spleen: Normal in size without focal abnormality. Adrenals/Urinary Tract: No urinary tract calculi or obstructive uropathy. No renal masses. The bladder is decompressed by Foley catheter. Adrenals are normal. Stomach/Bowel: There is a large mass within the left mid abdomen measuring 21.6 x 19 cm, and extending 22.5 cm in craniocaudal length. The mass abuts and displaces numerous loops of bowel, without evidence of high-grade obstruction. Vascular/Lymphatic: Minimal atherosclerosis of the aorta. Abdominal vasculature enhances normally. I do not see any evidence of active contrast extravasation or vascular injury. Reproductive: Prostate is unremarkable. Other: There is large volume ascites throughout the abdomen and pelvis, without state attenuation compatible with simple ascites. No evidence of intra-abdominal hemorrhage. There are numerous soft tissue nodule seen throughout the lower abdominal and pelvic mesentery consistent with mesenteric implants the soft tissue nodules can be seen within the right paracolic  gutter on image 61, right lower quadrant image 108, and lower pelvis images 111-112. There is a right inguinal hernia containing ascites. No bowel herniation. Ventriculostomy catheter enters the abdomen in the central upper abdomen, tip within the right lower quadrant. No free intraabdominal gas. There is diffuse subcutaneous edema throughout the anterior abdominal wall. Musculoskeletal: No acute or destructive bony lesions. Reconstructed images demonstrate no additional findings. IMPRESSION: 1. Large mesenteric mass within the left mid abdomen, compatible with malignancy of uncertain etiology. 2. Innumerable hepatic metastases. 3. Mesenteric nodularity consistent with mesenteric implants. 4. Large volume ascites.  No evidence of intra-abdominal hemorrhage. 5. Right mainstem intubation. Recommend retracting endotracheal tube 3 cm. Otherwise no acute intrathoracic process. These results were called by telephone at the time of interpretation on 02/19/2020 at 5:09 pm to provider [facility], who verbally acknowledged these results. Electronically Signed   By: MRanda NgoM.D.   On: 02/03/2020 17:09   DG CHEST PORT 1 VIEW  Result Date: 02/06/2020 CLINICAL DATA:  Central line placement, intubated, diffuse malignancy throughout the abdomen EXAM: PORTABLE CHEST 1 VIEW COMPARISON:  02/25/2020 FINDINGS: Single frontal view of the chest demonstrates endotracheal tube, Terri catheter, and right internal jugular catheter unchanged. New left internal jugular catheter tip projects over superior vena cava. Cardiac silhouette is unremarkable. No airspace disease, effusion, or pneumothorax. No acute bony abnormality. IMPRESSION: 1. Support devices as above. 2. No acute intrathoracic process. Electronically Signed   By: MRanda NgoM.D.   On: 02/11/2020 23:25   DG Chest Port 1 View  Result Date: 02/08/2020 CLINICAL DATA:  Post intubation EXAM: PORTABLE CHEST 1 VIEW COMPARISON:  09/25/2017 FINDINGS: Endotracheal tube  tip partially obscured by overlying leads. The tip appears to be just above the carina. Esophageal tube tip below the diaphragm but incompletely visualized. Faintly visible shunt tubing over the right chest. No focal airspace disease or effusion. Stable cardiomediastinal silhouette with aortic atherosclerosis. No pneumothorax. IMPRESSION: 1. Endotracheal tube tip just above the carina. 2. Clear lung fields Electronically Signed   By: Donavan Foil M.D.   On: 02/05/2020 16:03    Review of Systems  Unable to perform ROS: Intubated   Blood pressure 103/63, pulse (!) 115, temperature (!) 101.7 F (38.7 C), resp. rate (!) 26, height '5\' 6"'$  (1.676 m), weight 100.3 kg, SpO2 97 %. Physical Exam  Nursing note and vitals reviewed. Constitutional: He appears well-developed. No distress.  Intubated, appears to be in pain when abdomen examined.  Obese and poorly kempt.  HENT:  Head: Normocephalic.  Right Ear: External ear normal.  Left Ear: External ear normal.  Evidence of left facial injury  Eyes: Conjunctivae are normal. No scleral icterus.  Neck: JVD present.  Intubated  Cardiovascular: Regular rhythm and normal heart sounds.  No murmur heard. Regular tachycardia  Respiratory: Breath sounds normal. He has no wheezes. He has no rales.  Anteriorly clear to auscultation  GI: Soft. He exhibits distension. There is abdominal tenderness. There is no guarding.  Moderate to severe global distention, tender to palpation  Musculoskeletal:        General: Edema present.     Comments: 2+ bilateral lower extremity pitting edema  Skin: Skin is warm and dry. There is pallor.    Assessment/Plan: 1.  Acute kidney injury: Appears to be from multiple factors including ATN with relative hypotension in the setting of preceding ARB/diuretic use, tumor lysis and clinically suspected intra-abdominal hypertension with increasing abdominal distention in the setting of intravenous fluids.  His CT scan of the abdomen  did not show any hydronephrosis and I will send off for some urine electrolytes.  I recommend decreasing the rate of intravenous fluids to limit additional ascites/increased intra-abdominal pressure.  He needs quantification of intra-abdominal pressure and consequent therapeutic paracentesis with intravenous albumin to help improve renal perfusion.  At this time, he does not have any acute indications for CRRT and this will need to be done after discussions with the family regarding his overall prognosis with newly discovered intra-abdominal mass with hepatic metastasis.  Tumor lysis remains a distinct clinical consideration and rasburicase administration would be beneficial although I suspect that he has elevated uric acid level at baseline.  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  Anion gap metabolic acidosis: Secondary to acute kidney injury and possibly TLS/mild rhabdomyolysis along with lactic acidosis in the setting of preceding metformin use.  Improving with sodium bicarbonate drip. 3.  Hyperkalemia: Related to acute kidney injury and preceding potassium supplementation/ARB use.  Improving with IV fluids. 4.  Acute hypoxic respiratory failure: Intubated ventilator management per CCM. 5.  Shock/delirium: Ongoing intravenous fluids with targeted MAP/CVP per critical care service.   Kaydince Towles K. 02/22/2020, 6:33 AM

## 2020-02-22 NOTE — Significant Event (Signed)
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 Uric Acid 16. However due to progressive liver failure can not administer Hypouricemic agents. >> reviewed this as well during below conversation   Spoke to Nephrology Dr. Posey Pronto. Advised to give additional 2L and repeat labs. If no improvement will advises to call back for possible CRRT.

## 2020-02-22 NOTE — Progress Notes (Signed)
[facility] Progress Note Patient Name: Eric Houston DOB: June 29, 1956 MRN: [id number]   Date of Service  02/22/2020  HPI/Events of Note  Fever with abnormal LFT's  eICU Interventions  Cooling blanket ordered.        Kerry Kass Josafat Enrico 02/22/2020, 5:57 AM

## 2020-02-22 NOTE — Progress Notes (Signed)
Hypoglycemic Event  CBG: 60  Treatment: Amp of D50  Symptoms: None  Follow-up CBG: Time: 0430 CBG Result: 110  Possible Reasons for Event: NPO  Comments/MD notified: Orders given. Yes    Antonietta Breach

## 2020-02-22 NOTE — Progress Notes (Signed)
[place] Progress Note Patient Name: Eric Houston DOB: 12/05/1955 MRN: [id number]   Date of Service  02/22/2020  HPI/Events of Note  Patient is on a Fentanyl infusion but there is not an order on his [facility], bedside RN requesting an order.  eICU Interventions  Order entered for Fentanyl infusion.        [name] 02/22/2020, 12:41 AM

## 2020-02-22 NOTE — Progress Notes (Signed)
Unable to get accurate SpO2 on pt, have tried all extremities, multiple SpO2 probes, including nelcor forehead probe. CCM aware.

## 2020-02-23 DIAGNOSIS — K7200 Acute and subacute hepatic failure without coma: Secondary | ICD-10-CM

## 2020-02-23 DIAGNOSIS — N179 Acute kidney failure, unspecified: Secondary | ICD-10-CM

## 2020-02-23 LAB — COMPREHENSIVE METABOLIC PANEL
ALT: 989 U/L — ABNORMAL HIGH (ref 0–44)
ALT: 1107 U/L — ABNORMAL HIGH (ref 0–44)
AST: 4422 U/L — ABNORMAL HIGH (ref 15–41)
AST: 5279 U/L — ABNORMAL HIGH (ref 15–41)
Albumin: 2 g/dL — ABNORMAL LOW (ref 3.5–5.0)
Albumin: 1.9 g/dL — ABNORMAL LOW (ref 3.5–5.0)
Alkaline Phosphatase: 236 U/L — ABNORMAL HIGH (ref 38–126)
Alkaline Phosphatase: 255 U/L — ABNORMAL HIGH (ref 38–126)
Anion gap: 23 — ABNORMAL HIGH (ref 5–15)
Anion gap: 23 — ABNORMAL HIGH (ref 5–15)
BUN: 56 mg/dL — ABNORMAL HIGH (ref 8–23)
BUN: 57 mg/dL — ABNORMAL HIGH (ref 8–23)
CO2: 13 mmol/L — ABNORMAL LOW (ref 22–32)
CO2: 14 mmol/L — ABNORMAL LOW (ref 22–32)
Calcium: 8.1 mg/dL — ABNORMAL LOW (ref 8.9–10.3)
Calcium: 7.9 mg/dL — ABNORMAL LOW (ref 8.9–10.3)
Chloride: 105 mmol/L (ref 98–111)
Chloride: 104 mmol/L (ref 98–111)
Creatinine, Ser: 2.68 mg/dL — ABNORMAL HIGH (ref 0.61–1.24)
Creatinine, Ser: 2.8 mg/dL — ABNORMAL HIGH (ref 0.61–1.24)
GFR calc Af Amer: 28 mL/min — ABNORMAL LOW (ref >60)
GFR calc Af Amer: 26 mL/min — ABNORMAL LOW (ref >60)
GFR calc non Af Amer: 24 mL/min — ABNORMAL LOW (ref >60)
GFR calc non Af Amer: 23 mL/min — ABNORMAL LOW (ref >60)
Glucose, Bld: 72 mg/dL (ref 70–99)
Glucose, Bld: 92 mg/dL (ref 70–99)
Potassium: 6.4 mmol/L (ref 3.5–5.1)
Potassium: 6.4 mmol/L (ref 3.5–5.1)
Sodium: 141 mmol/L (ref 135–145)
Sodium: 141 mmol/L (ref 135–145)
Total Bilirubin: 1.4 mg/dL — ABNORMAL HIGH (ref 0.3–1.2)
Total Bilirubin: 0.9 mg/dL (ref 0.3–1.2)
Total Protein: 4.5 g/dL — ABNORMAL LOW (ref 6.5–8.1)
Total Protein: 4.5 g/dL — ABNORMAL LOW (ref 6.5–8.1)

## 2020-02-23 LAB — BASIC METABOLIC PANEL
Anion gap: 19 — ABNORMAL HIGH (ref 5–15)
BUN: 57 mg/dL — ABNORMAL HIGH (ref 8–23)
CO2: 15 mmol/L — ABNORMAL LOW (ref 22–32)
Calcium: 7.7 mg/dL — ABNORMAL LOW (ref 8.9–10.3)
Chloride: 105 mmol/L (ref 98–111)
Creatinine, Ser: 2.8 mg/dL — ABNORMAL HIGH (ref 0.61–1.24)
GFR calc Af Amer: 26 mL/min — ABNORMAL LOW (ref >60)
GFR calc non Af Amer: 23 mL/min — ABNORMAL LOW (ref >60)
Glucose, Bld: 135 mg/dL — ABNORMAL HIGH (ref 70–99)
Potassium: 5.7 mmol/L — ABNORMAL HIGH (ref 3.5–5.1)
Sodium: 139 mmol/L (ref 135–145)

## 2020-02-23 LAB — POTASSIUM: Potassium: 4.2 mmol/L (ref 3.5–5.1)

## 2020-02-23 LAB — LACTIC ACID, PLASMA: Lactic Acid, Venous: >11 mmol/L (ref 0.5–1.9)

## 2020-02-23 LAB — RASBURICASE - URIC ACID: Uric Acid, Serum: 13.4 mg/dL — ABNORMAL HIGH (ref 3.7–8.6)

## 2020-02-23 LAB — CALCIUM, IONIZED: Calcium, Ionized, Serum: 4.5 mg/dL (ref 4.5–5.6)

## 2020-02-23 LAB — GLUCOSE, CAPILLARY
Glucose-Capillary: 127 mg/dL — ABNORMAL HIGH (ref 70–99)
Glucose-Capillary: 70 mg/dL (ref 70–99)
Glucose-Capillary: 87 mg/dL (ref 70–99)
Glucose-Capillary: 65 mg/dL — ABNORMAL LOW (ref 70–99)
Glucose-Capillary: 70 mg/dL (ref 70–99)
Glucose-Capillary: 89 mg/dL (ref 70–99)
Glucose-Capillary: 57 mg/dL — ABNORMAL LOW (ref 70–99)
Glucose-Capillary: 75 mg/dL (ref 70–99)

## 2020-02-23 MED ORDER — ACETAMINOPHEN 325 MG PO TABS: 650.0000 mg | ORAL_TABLET | Freq: Four times a day (QID) | ORAL | Status: DC | PRN

## 2020-02-23 MED ORDER — ONDANSETRON 4 MG PO TBDP: 4.0000 mg | ORAL_TABLET | Freq: Four times a day (QID) | ORAL | Status: DC | PRN

## 2020-02-23 MED ORDER — GLYCOPYRROLATE 0.2 MG/ML IJ SOLN: 0.2000 mg | INTRAMUSCULAR | Status: DC | PRN

## 2020-02-23 MED ORDER — LORAZEPAM 2 MG/ML IJ SOLN: 2.0000 mg | INTRAMUSCULAR | Status: DC | PRN

## 2020-02-23 MED ORDER — ONDANSETRON HCL 4 MG/2ML IJ SOLN: 4.0000 mg | Freq: Four times a day (QID) | INTRAMUSCULAR | Status: DC | PRN

## 2020-02-23 MED ORDER — FENTANYL BOLUS VIA INFUSION
100.0000 ug | INTRAVENOUS | Status: DC | PRN
  Filled 2020-02-23: qty 100

## 2020-02-23 MED ORDER — GLYCOPYRROLATE 1 MG PO TABS: 1.0000 mg | ORAL_TABLET | ORAL | Status: DC | PRN

## 2020-02-23 MED ORDER — CALCIUM GLUCONATE-NACL 1-0.675 GM/50ML-% IV SOLN
1.0000 g | Freq: Once | INTRAVENOUS | Status: AC
  Administered 2020-02-23: 1000 mg via INTRAVENOUS
  Filled 2020-02-23: qty 50

## 2020-02-23 MED ORDER — DEXTROSE 50 % IV SOLN
12.5000 g | INTRAVENOUS | Status: AC
  Administered 2020-02-23: 12.5 g via INTRAVENOUS

## 2020-02-23 MED ORDER — DIPHENHYDRAMINE HCL 50 MG/ML IJ SOLN: 25.0000 mg | INTRAMUSCULAR | Status: DC | PRN

## 2020-02-23 MED ORDER — FENTANYL 2500MCG IN NS 250ML (10MCG/ML) PREMIX INFUSION
0.0000 ug/h | INTRAVENOUS | Status: DC
  Administered 2020-02-23: 100 ug/h via INTRAVENOUS
  Administered 2020-02-23: 200 ug/h via INTRAVENOUS
  Filled 2020-02-23: qty 250

## 2020-02-23 MED ORDER — FENTANYL CITRATE (PF) 100 MCG/2ML IJ SOLN: 50.0000 ug | INTRAMUSCULAR | Status: DC | PRN

## 2020-02-23 MED ORDER — DEXTROSE 50 % IV SOLN
INTRAVENOUS | Status: AC
  Administered 2020-02-23: 25 mL
  Filled 2020-02-23: qty 50

## 2020-02-23 MED ORDER — SODIUM BICARBONATE 8.4 % IV SOLN
50.0000 meq | Freq: Once | INTRAVENOUS | Status: AC
  Administered 2020-02-23: 50 meq via INTRAVENOUS
  Filled 2020-02-23: qty 50

## 2020-02-23 MED ORDER — ACETAMINOPHEN 650 MG RE SUPP: 650.0000 mg | Freq: Four times a day (QID) | RECTAL | Status: DC | PRN

## 2020-02-23 MED ORDER — DEXTROSE 50 % IV SOLN
INTRAVENOUS | Status: AC
  Administered 2020-02-23: 50 mL
  Filled 2020-02-23: qty 50

## 2020-02-23 MED ORDER — DEXTROSE 50 % IV SOLN
50.0000 mL | Freq: Once | INTRAVENOUS | Status: AC
  Administered 2020-02-23: 50 mL via INTRAVENOUS
  Filled 2020-02-23: qty 50

## 2020-02-23 MED ORDER — SODIUM POLYSTYRENE SULFONATE 15 GM/60ML PO SUSP
30.0000 g | Freq: Once | ORAL | Status: DC
  Filled 2020-02-23: qty 120

## 2020-02-23 MED ORDER — DEXTROSE 50 % IV SOLN
1.0000 | Freq: Once | INTRAVENOUS | Status: AC
  Administered 2020-02-23: 50 mL via INTRAVENOUS
  Filled 2020-02-23: qty 50

## 2020-02-23 MED ORDER — POLYVINYL ALCOHOL 1.4 % OP SOLN
1.0000 [drp] | Freq: Four times a day (QID) | OPHTHALMIC | Status: DC | PRN
  Filled 2020-02-23: qty 15

## 2020-02-23 MED ORDER — INSULIN ASPART 100 UNIT/ML ~~LOC~~ SOLN
10.0000 [IU] | Freq: Once | SUBCUTANEOUS | Status: AC
  Administered 2020-02-23: 10 [IU] via INTRAVENOUS

## 2020-02-23 MED ORDER — DEXTROSE 10 % IV SOLN: INTRAVENOUS | Status: DC

## 2020-02-23 MED ORDER — INSULIN ASPART 100 UNIT/ML IV SOLN
5.0000 [IU] | Freq: Once | INTRAVENOUS | Status: AC
  Administered 2020-02-23: 5 [IU] via INTRAVENOUS

## 2020-02-23 MED ORDER — DEXTROSE 5 % IV SOLN: INTRAVENOUS | Status: DC

## 2020-02-27 LAB — CULTURE, BLOOD (ROUTINE X 2)
Culture: NO GROWTH
Special Requests: ADEQUATE

## 2020-02-27 NOTE — Progress Notes (Signed)
PCCM INTERVAL PROGRESS NOTE  CMP result noted. K 6.4, Bicarb 13, BUN and creat worsening. Remains anuric Becoming even more clear he would benefit from HD.   I have attempted to call all numbers in EMR with no answer.  I have left voice messages at each number with a mailbox.   I have reviewed the [place] discussions from earlier in the day and family was not sure if they would want HD, so I do not feel this is appropriate scenario for emergent line placement.   Will provide further temporizing measures and await family contact.    Georgann Housekeeper, AGACNP-BC [place]  See Amion for personal pager PCCM on call pager [phone]  Feb 26, 2020 1:02 AM

## 2020-02-27 NOTE — Progress Notes (Signed)
[facility] Progress Note Patient Name: Eric Houston DOB: [birth year] MRN: [id number]   Date of Service  03-18-20  HPI/Events of Note  Persistent hyperkalemia despite Rx, latest K+ is 6.4, APP Georgann Housekeeper could not reach family during the night for permission to insert dialysis catheter and begin CRRT, family equivocal about dialysis during the day yesterday.  eICU Interventions  I will treat again with the moderate severity hyperkalemia protocol, while Georgann Housekeeper will once again try to reach the family for permission to insert dialysis catheter and begin CRRT.        Kerry Kass Briant Angelillo 03-18-20, 5:17 AM

## 2020-02-27 NOTE — Discharge Summary (Signed)
Physician Discharge Summary   Patient ID: Eric Houston [id number] 64 y.o. 1955/12/12  Admit date: 01/27/2020  Discharge date and time: No discharge date for patient encounter.   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] Anemia, unspecified type [D64.9] Metastatic malignant neoplasm, unspecified site [facility]) [C79.9]  Discharge Diagnoses:  Acute hypoxic respiratory failure Shock Multiorgan failure AKI hyperkalemia Acute liver injury hyperammonemia Lactic acidosis Tumor lysis syndrome Cancer, unknown primary Anemia   Admission Condition: critical  Discharged Condition: deceased  Indication for Admission: acute encephalopathy, acute respiratory failure with hypoxia  Hospital Course: Eric Houston was admitted with acute respiratory failure, AKI, shock, and acute encephalopathy, unable to protect his airway. A large intraabdominal mass and liver mets were found on imaging, a new diagnosis. He was admitted to the ICU for aggressive medical management of his MOF with antibiotics, pressors, and medications to treat his metabolic derangements and tumor lysis syndrome. Despite aggressive care, he continued to deteriorate and his family made the decision to withdraw aggressive care. He expired at 14:21 on 03/09/20 with family at bedside.  Consults: pulmonary/intensive care and nephrology  Significant Diagnostic Studies:  5/26 CT chest/ a/p >> 1. Large mesenteric mass within the left mid abdomen, compatible with malignancy of uncertain etiology. 2. Innumerable hepatic metastases. 3. Mesenteric nodularity consistent with mesenteric implants. 4. Large volume ascites. No evidence of intra-abdominal hemorrhage. 5. Right mainstem intubation. Recommend retracting endotracheal tube 3 cm. Otherwise no acute  intrathoracic process.  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 No acute intracranial abnormality is noted.  CT of the cervical spine: Multilevel degenerative change and postoperative change without acute abnormality  Treatments:  Mechanical ventilation Vasopressors Antibiotics Lactulose IVF Bicarb rasburicase Blood transfusions   Disposition: funeral home of the family's choosing     Signed: Julian Hy Mar 09, 2020 6:15 PM

## 2020-02-27 NOTE — Progress Notes (Signed)
Potassium level 6.4 again this am despite measures to reduce with previous orders. Elink notified, awaiting new orders.

## 2020-02-27 NOTE — Progress Notes (Signed)
Chaplain engaged in initial visit with Bevan's family around bedside.  Chaplain offered support.  Family wants chaplain to be of assistance to [facility] son.  Nurse will page chaplain when sons have come back to the room.   Chaplain will follow-up.

## 2020-02-27 NOTE — Progress Notes (Signed)
Chaplain engaged in follow-up visit with Eric Houston and his family.  Chaplain was able to learn about Eric Houston through sons.  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  He also really loved wrestling.  Chaplain offered the ministries of presence, listening and prayer as they shared about their dad.    Chaplain will follow-up as needed.

## 2020-02-27 NOTE — Progress Notes (Signed)
Pt extubated to comfort care per physician order/family request. Pt suctioned via ETT and orally prior. Pt extubated to room air, family at bedisde. RT will be available if needed.

## 2020-02-27 NOTE — Progress Notes (Signed)
Admit: 02/19/2020 LOS: 3  64 year old man with hypertension, diabetes, COPD, hydrocephalus, osteoarthritis who presented with dizziness ultimately with significant decompensation requiring pressors and intubation found to have signs concerning for extensive metastatic malignancy.  Hospitalization complicated by severe renal failure.  Subjective:  . Family was able to see the patient but are very overwhelmed with the diagnoses and clinical picture.  There has been a presumption that he had not had many medical problems before this.  He is expressed in the past that he does not like hospitals and would not want extensive medical care.  Dialysis and other options have been discussed with the family but they feel very overwhelmed and cannot answer questions.  Patient had hyperkalemia requiring medical treatment several times overnight.  Patient remains intubated and sedated on my exam  05/27 0701 - 05/28 0700 In: 4446.8 [I.V.:3525.4; NG/GT:400; IV Piggyback:471.4] Out: 65 [Urine:65]  Filed Weights   01/30/2020 1433 02/22/20 0241  Weight: 95.3 kg 100.3 kg    Scheduled Meds: . chlorhexidine gluconate (MEDLINE KIT)  15 mL Mouth Rinse BID  . Chlorhexidine Gluconate Cloth  6 each Topical Daily  . docusate  100 mg Per Tube BID  . heparin injection (subcutaneous)  5,000 Units Subcutaneous Q8H  . lactulose  20 g Per Tube BID  . mouth rinse  15 mL Mouth Rinse 10 times per day  . polyethylene glycol  17 g Oral Daily  . sodium polystyrene  30 g Oral Once  . sodium zirconium cyclosilicate  10 g Oral BID   Continuous Infusions: . ceFEPime (MAXIPIME) IV 2 g (02/29/20 0947)  . dextrose 50 mL/hr at 2020/02/29 0953  . dextrose    . famotidine (PEPCID) IV 20 mg (February 29, 2020 1049)  . fentaNYL infusion INTRAVENOUS    . norepinephrine (LEVOPHED) Adult infusion 18 mcg/min (February 29, 2020 0700)  . sodium bicarbonate 150 mEq in dextrose 5% 1000 mL 125 mL/hr at 2020/02/29 0700   PRN Meds:.acetaminophen **OR** acetaminophen,  diphenhydrAMINE, docusate sodium, fentaNYL, fentaNYL (SUBLIMAZE) injection, glycopyrrolate **OR** glycopyrrolate **OR** glycopyrrolate, LORazepam, midazolam, midazolam, ondansetron **OR** ondansetron (ZOFRAN) IV, polyethylene glycol, polyvinyl alcohol  Current Labs: reviewed    Physical Exam:  Blood pressure 98/60, pulse (!) 112, temperature 98.8 F (37.1 C), resp. rate 20, height _0  (1.676 m), weight 100.3 kg, SpO2 100 %. GEN: Ill-appearing, sedated, lying in bed ENT: no nasal discharge, mmm CV: Tachycardia, no obvious murmur PULM: Coarse bilateral breath sounds, ventilated ABD: Markedly distended abdomen, diminished bowel sounds SKIN: Skin pallor, otherwise no rashes or jaundice EXT: 2+ pitting edema in the bilateral lower extremities, warm legs  A 64 year old man with hypertension, diabetes, COPD, hydrocephalus, osteoarthritis who presented with dizziness ultimately with significant decompensation requiring pressors and intubation found to have signs concerning for extensive metastatic malignancy.  Hospitalization complicated by severe renal failure.  P . Oliguric acute kidney injury, severe: Nearly anuric.  Previously normal baseline.  Most likely secondary to ATN and possible spotaneous TLS.  Extremely poor prognosis and very overwhelming for the family.  Given his overall trajectory, concern for metastatic malignancy, and previous voicing that he would not want extensive inpatient medical care we will not offer dialysis at this time as it is unlikely to improve his overall clinical outcome.  If the family is able to come to a decision and has strong feelings that he would benefit from dialysis we can have an open discussion regarding that.  We will continue medical management at this time 1. Continue goals of care conversations with family  2. Would not start dialysis at this time unless family feels strongly otherwise 3. Status post rasburicase for possible TLS 4. Manage electrolyte  problems and acidosis as below 5. Continue aggressive hemodynamic support and monitor ins and outs . Anion gap metabolic acidosis secondary to lactic acidosis: Likely related to malignancy with increased demands, liver dysfunction with decreased clearance, and hypotension.  Will continue IV bicarbonate and continue to monitor.  Agree with aggressive hemodynamic support per primary team . Hyperkalemia: Associated with renal failure caused by high tissue turnover.  Managing medically at this time with shifting agents and Lokelma.  Could consider IV diuretics if needed. . Acute hypoxic respiratory failure: Intubated and management per primary team . Shock/confusion: Hemodynamic support per primary team.  Continues to require pressors. . Medication Issues; o Preferred narcotic agents for pain control are hydromorphone, fentanyl, and methadone. Morphine should not be used.  o Baclofen should be avoided o Avoid oral sodium phosphate and magnesium citrate based laxatives / bowel preps    Santiago Bumpers, MD 2020/03/03, 11:49 AM  Recent Labs  Lab 02/07/2020 2310 01/29/2020 2311 02/22/20 0202 02/22/20 0202 02/22/20 2820 02/22/20 1514 02/22/20 2350 02/22/20 2350 03/03/2020 0239 March 03, 2020 0559 03/03/20 0937  NA  --    < > 139   < > 138   < > 141  --  141 139  --   K  --    < > 5.2*   < > 5.3*   < > 6.4*   < > 6.4* 5.7* 4.2  CL  --    < > 107   < > 107   < > 105  --  104 105  --   CO2  --    < > 16*   < > 16*   < > 13*  --  14* 15*  --   GLUCOSE  --    < > 86   < > 113*   < > 72  --  92 135*  --   BUN  --    < > 47*   < > 50*   < > 56*  --  57* 57*  --   CREATININE  --    < > 1.94*   < > 2.19*   < > 2.68*  --  2.80* 2.80*  --   CALCIUM  --    < > 9.1   < > 8.5*   < > 8.1*  --  7.9* 7.7*  --   PHOS 6.3*  --  6.1*  --  6.4*  --   --   --   --   --   --    < > = values in this interval not displayed.   Recent Labs  Lab 02/02/2020 1535 01/30/2020 1657 02/22/20 0023 02/22/20 0144 02/22/20 0202  WBC  21.2*  --   --   --  36.3*  NEUTROABS 14.8*  --   --   --   --   HGB 6.9*   < > 9.3* 8.8* 8.8*  HCT 24.1*   < > 27.7* 26.0* 26.6*  MCV 102.6*  --   --   --  90.8  PLT 286  --   --   --  244   < > = values in this interval not displayed.    Plan communicated to the primary team

## 2020-02-27 NOTE — Progress Notes (Signed)
Nutrition Brief Note  Chart reviewed. Plans for patient to transition to comfort care later today when family arrives.  No nutrition interventions warranted at this time.  Please consult as needed.   Lucas Mallow, RD, LDN, CNSC Please refer to [facility] for contact information.

## 2020-02-27 NOTE — Progress Notes (Addendum)
NAME:  Eric Houston, MRN:  [id number], DOB:  03-10-1956, LOS: 2 ADMISSION DATE:  02/25/2020, CONSULTATION DATE:  02/11/2020 REFERRING MD:  Dr. Alvino Chapel, CHIEF COMPLAINT:  Respiratory arrest/ shock  Brief History   14 yoM who was found laying in yard, reportedly for over 4 hours after complaints of dizziness/ weakness who acutely decompensated in ER with altered mental status and respiratory arrest, now intubated with shock and profound acidosis.  CT of abd/ pelvis concerning for diffuse metastatic process, origin unclear.    History of present illness   HPI obtained from medical chart review as patient is unresponsive, intubated, and mechanical ventilation.  Family not able to be found or reached by phone at this time.   64 year old male with prior history of COPD, DMT2, HA, hydrocephalus with prior VP shunt, HTN, OSA, arthritis, and former smoker presenting to ER by EMS after patient was found lying in the yard by his son.  Patient reported he got dizzy and fell and couldn't get up and had been laying there for around 4 hours.  He was initially able to answer questions,  alert, slightly tachycardic otherwise hemodynamically stable on arrival to ER.  Shortly after, staff were called into room where he acutely decompensated, was unresponsive and hypoxic requiring emergent intubation.  Afterwards he became acutely hypotensive requiring vasopressor support.  Workup noted for WBC 21.2, Hgb 6.9,  Alk phos 191, albumin 1.9, AST 145, ALT 46, SARS2 negative, foley placed but no urine output thus far, BMET pending.  2 units of PRBC ordered along with bicarb gtt.  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  He has remained unresponsive, not requiring sedation.  PCCM called for admit.   Of note, patient had  recent ER evaluation on 01/26/2020 for generalized weakness and reported syncopal episodes/ falls.  Workup was unremarkable except for a Hgb of 9 and discharged home with patient to follow up outpatient.    Past Medical History  COPD, DMT2, HA, hydrocephalus with prior VP shunt, HTN, OSA, arthritis, former smoker   Significant Hospital Events   5/26 Admit  Consults:   Procedures:  5/26 ETT >> 5/26 Foley >> 5/26 CVC>>  Significant Diagnostic Tests:  5/26 CT chest/ a/p >> 1. Large mesenteric mass within the left mid abdomen, compatible with malignancy of uncertain etiology. 2. Innumerable hepatic metastases. 3. Mesenteric nodularity consistent with mesenteric implants. 4. Large volume ascites.  No evidence of intra-abdominal hemorrhage. 5. Right mainstem intubation. Recommend retracting endotracheal tube 3 cm. Otherwise no acute intrathoracic process.  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 No acute intracranial abnormality is noted.  CT of the cervical spine: Multilevel degenerative change and postoperative change without acute abnormality  Micro Data:  5/26 SARS2 >> neg 5/26  BCx2 >> 5/26 UC >>  Antimicrobials:  5/26 zosyn  5/26 cefepime>> 5/26 vancomycin  >>  Interim history/subjective:  Progressive MODS, worsening shock Not a candidate for HD after discussion with nephrology  Discussed with both sons-- plan to transition to comfort care upon their arrival mid day 5/28. DNR now   Objective   Blood pressure 98/60, pulse (!) 108, temperature 98.8 F (37.1 C), resp. rate 13, height _0  (1.676 m), weight 100.3 kg, SpO2 91 %.  Vent Mode: PRVC FiO2 (%):  [35 %-40 %] 35 % Set Rate:  [26 bmp] 26 bmp Vt Set:  [510 mL] 510 mL PEEP:  [5 cmH20] 5 cmH20 Pressure Support:  [5 cmH20] 5 cmH20   Intake/Output Summary (Last 24 hours) at 02/25/20 4585 Last data filed at Feb 25, 2020 0700 Gross per 24 hour    Intake 3919.2 ml  Output 0 ml  Net 3919.2 ml   Filed Weights   01/30/2020 1433 02/22/20 0241  Weight: 95.3 kg 100.3 kg   Examination: General:  Critically ill and chronically ill appearing M, appears older than stated age, intubated sedated NAD  Neuro: + cough, initiating spontaneously breaths, moves BUE BLE, responds to pain, not following commands  HEENT: Temporal muscle wasting. Pink mmm. ETT secure CV: tachycardic rate regular rhythm. s1s2 no rgm.  PULM:  Diminished bibasilar sounds.  Symmetrical chest expansion. No rhonchi. GI: Distended, large mass. Generalized tenderness GU: Foley with scant clear yellow urine collection Extremities: BLE 3+ edema. BUE no-pitting edema. No obvious joint deformity. Dusky extremity Skin: Pale cool, no rash.   Resolved Hospital Problem list    Assessment & Plan:    Goals of Care -pt with progressive shock and progressive MODS -numerous conversations with family-- plan 5/28 is transition to comfort care when family able to arrive.  Continue current interventions in interim; code status updated to DNR after family conversations -tentative time of arrival noon. Will ask chaplain for support in comforting family   Acute toxic metabolic encephalopathy -suspect TLS in setting of large tumor burden, possible septic shock, likely component of HE with ammonia >50, and CNS meds for sedation P -continue correcting abnormalities as able, lactulose.  -will dc when transitioned to comfort care   Acute hypoxic respiratory failure requiring intubation -sudden resp arrest in ED, did not have precceeding sx consistent with PE, however likely metastatic dz would put pt at greater risk. CT chest (not CTA) without evidence of PE but this imagine is suboptimal for evaluating  P -Continue MV until comfort care -will compassionately liberate from vent at end of life with family at bedside.  -will ensure medication available for sx of dyspnea, air hunger at end  of life, as well as secretion management  Shock, worsening Progressive MODS, detailed below  AGMA Lactic acidosis, worsening -unclear etiology -concern for sepsis with leukocytosis and fever, however this could be attributed to malignancy as well. -possibly initial intravascualr hypovolemia after being found down x several hours outside in heat, has been volume resuscitated  P - NE for goal > 65 -will dc when transitioned to comfrot care  Concern for Tumor Lysis Syndrome -large appearing tumor burden.  -allopurinol allergy ([facility])  -Uric acid 16.7, hyperkalemia, hyperphosphatemia, LDH 3367 -s/p rasburicase, uric acid down to 13.4  Underlying malignancy with large intraabdominal mass and numerous hepatic masses, new finding -unclear primary P -will be transitioning to comfort care  Acute renal failure with anuria  -likely multifactorial etiology: ATN, prior ARB, likely TLS with elevated intraabdominal pressure -Possible that elevated intraabdominal pressure is reducing renal perfusion P -discussed with nephrology 5/28 -- not a candidate for dialysis    Acute liver injury with hyperammonemia, coagulopathy, worsening hypoglycemia -innumerable hepatic masses -possible component of shock liver as well  P -starting d10 -supportive care until transition to comfort   Elevated bladder pressure/ intraabdominal hypertension -22 -suspect multifactorial: size of tumors  + ascites  -possible that tumor is compressing IVC  P -no safe window for para  Hyperkalemia -ARF vs TLS. Improving on bicarb gtt Hyperphosphatemia -in setting of Acute renal failure vs TLS Hypomagnesemia  P -temporize hyperK as able, not a candidate for CRRT   Anemia, without obvious source of active bleeding -chronic disease vs marrow suppression in setting of malignancy -received 2 PRBC P -trend H/H -hgb goal > 7  Best practice:  Diet: npo Pain/Anxiety/Delirium protocol (if indicated): fentanyl/  versed  VAP protocol (if indicated): yes DVT prophylaxis: scd GI prophylaxis: PPI Glucose control: d10 Mobility: BR Code Status: DNR Family Communication: numerous conversations with family 5/28 Disposition: ICU, expectant transition to comfort care 5/28  Labs   CBC: Recent Labs  Lab 02/13/2020 1535 02/02/2020 1535 02/22/2020 1657 02/20/2020 1830 02/22/20 0023 02/22/20 0144 02/22/20 0202  WBC 21.2*  --   --   --   --   --  36.3*  NEUTROABS 14.8*  --   --   --   --   --   --   HGB 6.9*   < > 5.8* 6.5* 9.3* 8.8* 8.8*  HCT 24.1*   < > 17.0* 19.0* 27.7* 26.0* 26.6*  MCV 102.6*  --   --   --   --   --  90.8  PLT 286  --   --   --   --   --  244   < > = values in this interval not displayed.    Basic Metabolic Panel: Recent Labs  Lab 02/25/2020 2310 02/06/2020 2311 02/22/20 0202 02/22/20 0202 02/22/20 0909 02/22/20 1514 02/22/20 2350 03/21/20 0239 03/21/2020 0559  NA  --    < > 139   < > 138 142 141 141 139  K  --    < > 5.2*   < > 5.3* 5.9* 6.4* 6.4* 5.7*  CL  --    < > 107   < > 107 108 105 104 105  CO2  --    < > 16*   < > 16* 12* 13* 14* 15*  GLUCOSE  --    < > 86   < > 113* 118* 72 92 135*  BUN  --    < > 47*   < > 50* 52* 56* 57* 57*  CREATININE  --    < > 1.94*   < > 2.19* 2.35* 2.68* 2.80* 2.80*  CALCIUM  --    < > 9.1   < > 8.5* 8.0* 8.1* 7.9* 7.7*  MG 1.5*  --  1.5*  --   --   --   --   --   --   PHOS 6.3*  --  6.1*  --  6.4*  --   --   --   --    < > = values in this interval not displayed.   GFR: Estimated Creatinine Clearance: 29.6 mL/min (A) (by C-G formula based on SCr of 2.8 mg/dL (H)). Recent Labs  Lab 02/02/2020 1535 02/16/2020 2310 02/22/20 0202 02/22/20 0436 21-Mar-2020 0508  PROCALCITON  --  12.09  --   --   --   WBC 21.2*  --  36.3*  --   --   LATICACIDVEN  --  8.5* 7.0* 6.4* >11.0*    Liver Function Tests: Recent Labs  Lab 02/22/20 0202 02/22/20 0909 02/22/20 1514 02/22/20 2350 03-21-2020 0239  AST 1,695* 2,663* 3,737* 4,422* 5,279*  ALT 444*  693* 770* 989* 1,107*  ALKPHOS 182* 195* 209* 236* 255*  BILITOT 1.6* 1.7* 1.4* 1.4* 0.9  PROT 4.5* 4.6* 4.6* 4.5* 4.5*  ALBUMIN 1.8* 1.9* 2.0* 2.0* 1.9*   No results for input(s): LIPASE, AMYLASE in the last 168 hours. Recent Labs  Lab 02/16/2020 2312  AMMONIA 57*    ABG    Component Value Date/Time   PHART 7.287 (L) 02/22/2020 0144   PCO2ART 31.3 (L) 02/22/2020 0144   PO2ART 150 (H) 02/22/2020 0144   HCO3 14.9 (L) 02/22/2020 0144   TCO2 16 (L) 02/22/2020 0144   ACIDBASEDEF 11.0 (H) 02/22/2020 0144   O2SAT 99.0 02/22/2020 0144     Coagulation Profile: Recent Labs  Lab 02/16/2020 2312  INR 1.7*    Cardiac Enzymes: Recent Labs  Lab 02/18/2020 2311  CKTOTAL 393    HbA1C: Hgb A1c MFr Bld  Date/Time Value Ref Range Status  08/09/2016 05:02 PM 5.7 (H) 4.8 - 5.6 % Final    Comment:    (NOTE)         Pre-diabetes: 5.7 - 6.4         Diabetes: >6.4         Glycemic control for adults with diabetes: <7.0   12/05/2009 10:50 PM 5.7 4.6 - 6.1 % Final    Comment:    See lab report for associated comment(s)    CBG: Recent Labs  Lab 2020-02-29 0139 02-29-2020 0209 02-29-2020 0335 2020-02-29 0751 02-29-2020 0847  GLUCAP 70 127* 87 65* 70   CRITICAL CARE Performed by: Cristal Generous   Total critical care time: 55 minutes  Critical care time was exclusive of separately billable procedures and treating other patients.  Critical care was necessary to treat or prevent imminent or life-threatening deterioration.  Critical care was time spent personally by me on the following activities: development of treatment plan with patient and/or surrogate as well as nursing, discussions with consultants, evaluation of patient's response to treatment, examination of patient, obtaining history from patient or surrogate, ordering and performing treatments and interventions, ordering and review of laboratory studies, ordering and review of radiographic studies, pulse oximetry and re-evaluation  of patient's condition.  Eliseo Gum MSN, AGACNP-BC [employer] [phone] If no answer, [phone] Feb 29, 2020, 9:21 AM

## 2020-02-27 DEATH — deceased
